# Patient Record
Sex: Female | Born: 1990 | Race: White | Hispanic: No | Smoking: Current every day smoker
Health system: Southern US, Community
[De-identification: ages and names within clinical notes are randomized; demographics above are authoritative.]

## PROBLEM LIST (undated history)

## (undated) DIAGNOSIS — F4310 Post-traumatic stress disorder, unspecified: Secondary | ICD-10-CM

## (undated) DIAGNOSIS — Z331 Pregnant state, incidental: Secondary | ICD-10-CM

## (undated) DIAGNOSIS — R87619 Unspecified abnormal cytological findings in specimens from cervix uteri: Secondary | ICD-10-CM

## (undated) DIAGNOSIS — Z720 Tobacco use: Secondary | ICD-10-CM

## (undated) DIAGNOSIS — I10 Essential (primary) hypertension: Secondary | ICD-10-CM

## (undated) DIAGNOSIS — F411 Generalized anxiety disorder: Secondary | ICD-10-CM

## (undated) DIAGNOSIS — IMO0002 Reserved for concepts with insufficient information to code with codable children: Secondary | ICD-10-CM

## (undated) DIAGNOSIS — N289 Disorder of kidney and ureter, unspecified: Secondary | ICD-10-CM

## (undated) DIAGNOSIS — J45909 Unspecified asthma, uncomplicated: Secondary | ICD-10-CM

## (undated) HISTORY — DX: Reserved for concepts with insufficient information to code with codable children: IMO0002

## (undated) HISTORY — DX: Essential (primary) hypertension: I10

## (undated) HISTORY — DX: Tobacco use: Z72.0

## (undated) HISTORY — DX: Unspecified abnormal cytological findings in specimens from cervix uteri: R87.619

## (undated) HISTORY — DX: Unspecified asthma, uncomplicated: J45.909

---
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
## 2011-03-14 NOTE — Op Note (Signed)
NAMEWILMETTA, SPEISER               ACCOUNT NO.:  [id number]   MEDICAL RECORD NO.:  [id number]          PATIENT TYPE:  AMB   LOCATION:  SDS                          FACILITY:  [facility]   PHYSICIAN:  Jon Gills, M.D.  DATE OF BIRTH:  11-Jul-1991   DATE OF PROCEDURE:  12/26/2005  DATE OF DISCHARGE:  12/26/2005                                 OPERATIVE REPORT   PREOPERATIVE DIAGNOSIS:  Epigastric abdominal pain.   POSTOPERATIVE DIAGNOSIS:  Epigastric abdominal pain.   PROCEDURE:  Upper endoscopy.   SURGEON:  Jon Gills, M.D.   ASSISTANTS:  None   DESCRIPTION OF FINDINGS:  Following informed written consent, the patient  was taken to the operating room and placed under general anesthesia with  continuous cardiopulmonary monitoring.  She remained in the supine position  and Olympus endoscope was inserted by mouth and advanced without difficulty.  There was no visual evidence for esophagitis, gastritis, duodenitis or  peptic ulcer disease.  Minimal nodularity was present in her stomach.  A  solitary gastric biopsy was negative for Helicobacter.  Esophageal, gastric  and duodenal biopsies were obtained and were also unremarkable.  The  endoscope was gradually withdrawn and she was awakened and taken to the  recovery room in satisfactory condition.  She will be released later today  to the care of her family.   DESCRIPTION OF TECHNICAL PROCEDURE USED:  Olympus GIF-160 endoscope with  cold biopsy forceps.   DESCRIPTION OF SPECIMENS REMOVED:  Esophagus x3 in formalin, gastric x1 for  CLOtesting, gastric x3 in formalin, duodenum x3 in formalin.           ______________________________  Jon Gills, M.D.     JHC/MEDQ  D:  01/29/2006  T:  01/30/2006  Job:  [id number]   cc:   Donna Bernard, M.D.  Fax: [phone]

## 2011-03-15 ENCOUNTER — Emergency Department (HOSPITAL_COMMUNITY)
Admission: EM | Admit: 2011-03-15 | Discharge: 2011-03-15 | Disposition: A | Discharge status: Home / Self Care | Payer: PRIVATE HEALTH INSURANCE | Attending: Emergency Medicine | Admitting: Emergency Medicine

## 2011-03-15 ENCOUNTER — Emergency Department (HOSPITAL_COMMUNITY): Payer: PRIVATE HEALTH INSURANCE

## 2011-03-15 DIAGNOSIS — R109 Unspecified abdominal pain: Secondary | ICD-10-CM | POA: Insufficient documentation

## 2011-03-15 DIAGNOSIS — N201 Calculus of ureter: Secondary | ICD-10-CM | POA: Insufficient documentation

## 2011-03-15 LAB — COMPREHENSIVE METABOLIC PANEL
ALT: 13 U/L (ref 0–35)
AST: 18 U/L (ref 0–37)
Alkaline Phosphatase: 67 U/L (ref 39–117)
CO2: 23 mEq/L (ref 19–32)
Chloride: 101 mEq/L (ref 96–112)
GFR calc Af Amer: >60 mL/min (ref >60)
GFR calc non Af Amer: >60 mL/min (ref >60)
Glucose, Bld: 125 mg/dL — ABNORMAL HIGH (ref 70–99)
Potassium: 3.5 mEq/L (ref 3.5–5.1)
Sodium: 138 mEq/L (ref 135–145)
Total Bilirubin: 1.1 mg/dL (ref 0.3–1.2)

## 2011-03-15 LAB — URINALYSIS, ROUTINE W REFLEX MICROSCOPIC
Bilirubin Urine: NEGATIVE
Ketones, ur: NEGATIVE mg/dL
Nitrite: NEGATIVE
Specific Gravity, Urine: >1.03 — ABNORMAL HIGH (ref 1.005–1.030)
Urobilinogen, UA: 0.2 mg/dL (ref 0.0–1.0)

## 2011-03-15 LAB — CBC
HCT: 39.8 % (ref 36.0–46.0)
Hemoglobin: 14.2 g/dL (ref 12.0–15.0)
MCHC: 35.7 g/dL (ref 30.0–36.0)
RBC: 5.01 MIL/uL (ref 3.87–5.11)

## 2011-03-15 LAB — DIFFERENTIAL
Basophils Absolute: 0 10*3/uL (ref 0.0–0.1)
Basophils Relative: 1 % (ref 0–1)
Lymphocytes Relative: 34 % (ref 12–46)
Monocytes Absolute: 0.4 10*3/uL (ref 0.1–1.0)
Neutro Abs: 4.6 10*3/uL (ref 1.7–7.7)
Neutrophils Relative %: 59 % (ref 43–77)

## 2011-03-15 LAB — LIPASE, BLOOD: Lipase: 14 U/L (ref 11–59)

## 2011-03-15 LAB — URINE MICROSCOPIC-ADD ON

## 2011-08-01 LAB — URINALYSIS, ROUTINE W REFLEX MICROSCOPIC
Glucose, UA: NEGATIVE
Protein, ur: NEGATIVE
Specific Gravity, Urine: 1.01
Urobilinogen, UA: 0.2

## 2011-08-14 LAB — URINALYSIS, ROUTINE W REFLEX MICROSCOPIC
Ketones, ur: NEGATIVE
Nitrite: NEGATIVE
Protein, ur: NEGATIVE
pH: 7.5

## 2011-08-14 LAB — URINE MICROSCOPIC-ADD ON

## 2011-11-24 ENCOUNTER — Other Ambulatory Visit (HOSPITAL_COMMUNITY)
Admission: RE | Admit: 2011-11-24 | Discharge: 2011-11-24 | Disposition: A | Discharge status: Home / Self Care | Payer: PRIVATE HEALTH INSURANCE | Source: Ambulatory Visit | Attending: Obstetrics and Gynecology | Admitting: Obstetrics and Gynecology

## 2011-11-24 DIAGNOSIS — Z01419 Encounter for gynecological examination (general) (routine) without abnormal findings: Secondary | ICD-10-CM | POA: Insufficient documentation

## 2011-11-24 DIAGNOSIS — Z113 Encounter for screening for infections with a predominantly sexual mode of transmission: Secondary | ICD-10-CM | POA: Insufficient documentation

## 2011-11-24 DIAGNOSIS — R87810 Cervical high risk human papillomavirus (HPV) DNA test positive: Secondary | ICD-10-CM | POA: Insufficient documentation

## 2011-11-24 LAB — OB RESULTS CONSOLE RUBELLA ANTIBODY, IGM: Rubella: IMMUNE

## 2011-11-24 LAB — OB RESULTS CONSOLE ANTIBODY SCREEN: Antibody Screen: NEGATIVE

## 2011-11-24 LAB — OB RESULTS CONSOLE ABO/RH: RH Type: NEGATIVE

## 2011-11-24 LAB — OB RESULTS CONSOLE HEPATITIS B SURFACE ANTIGEN: Hepatitis B Surface Ag: NEGATIVE

## 2011-11-25 ENCOUNTER — Other Ambulatory Visit: Payer: Self-pay | Admitting: Adult Health

## 2011-12-27 ENCOUNTER — Emergency Department (HOSPITAL_COMMUNITY): Payer: PRIVATE HEALTH INSURANCE

## 2011-12-27 ENCOUNTER — Encounter (HOSPITAL_COMMUNITY): Payer: Self-pay | Admitting: *Deleted

## 2011-12-27 ENCOUNTER — Emergency Department (HOSPITAL_COMMUNITY)
Admission: EM | Admit: 2011-12-27 | Discharge: 2011-12-27 | Disposition: A | Discharge status: Home / Self Care | Payer: PRIVATE HEALTH INSURANCE | Attending: Emergency Medicine | Admitting: Emergency Medicine

## 2011-12-27 DIAGNOSIS — R109 Unspecified abdominal pain: Secondary | ICD-10-CM | POA: Insufficient documentation

## 2011-12-27 DIAGNOSIS — N200 Calculus of kidney: Secondary | ICD-10-CM | POA: Insufficient documentation

## 2011-12-27 DIAGNOSIS — O9989 Other specified diseases and conditions complicating pregnancy, childbirth and the puerperium: Secondary | ICD-10-CM | POA: Insufficient documentation

## 2011-12-27 DIAGNOSIS — R112 Nausea with vomiting, unspecified: Secondary | ICD-10-CM | POA: Insufficient documentation

## 2011-12-27 HISTORY — DX: Pregnant state, incidental: Z33.1

## 2011-12-27 HISTORY — DX: Disorder of kidney and ureter, unspecified: N28.9

## 2011-12-27 LAB — URINE MICROSCOPIC-ADD ON

## 2011-12-27 LAB — URINALYSIS, ROUTINE W REFLEX MICROSCOPIC
Bilirubin Urine: NEGATIVE
Glucose, UA: NEGATIVE mg/dL
Specific Gravity, Urine: >1.03 — ABNORMAL HIGH (ref 1.005–1.030)

## 2011-12-27 LAB — DIFFERENTIAL
Basophils Absolute: 0 10*3/uL (ref 0.0–0.1)
Lymphocytes Relative: 8 % — ABNORMAL LOW (ref 12–46)
Monocytes Absolute: 0.3 10*3/uL (ref 0.1–1.0)
Neutro Abs: 12 10*3/uL — ABNORMAL HIGH (ref 1.7–7.7)
Neutrophils Relative %: 90 % — ABNORMAL HIGH (ref 43–77)

## 2011-12-27 LAB — CBC
HCT: 39.7 % (ref 36.0–46.0)
RDW: 12.7 % (ref 11.5–15.5)
WBC: 13.3 10*3/uL — ABNORMAL HIGH (ref 4.0–10.5)

## 2011-12-27 LAB — BASIC METABOLIC PANEL
CO2: 21 mEq/L (ref 19–32)
Chloride: 98 mEq/L (ref 96–112)
Creatinine, Ser: 0.68 mg/dL (ref 0.50–1.10)
GFR calc Af Amer: >90 mL/min (ref >90)
Potassium: 3.4 mEq/L — ABNORMAL LOW (ref 3.5–5.1)
Sodium: 134 mEq/L — ABNORMAL LOW (ref 135–145)

## 2011-12-27 LAB — PREGNANCY, URINE: Preg Test, Ur: POSITIVE — AB

## 2011-12-27 MED ORDER — ONDANSETRON HCL 4 MG/2ML IJ SOLN
4.0000 mg | Freq: Once | INTRAMUSCULAR | Status: AC
  Administered 2011-12-27: 4 mg via INTRAVENOUS
  Filled 2011-12-27: qty 2

## 2011-12-27 MED ORDER — HYDROMORPHONE HCL PF 1 MG/ML IJ SOLN
1.0000 mg | Freq: Once | INTRAMUSCULAR | Status: AC
  Administered 2011-12-27: 1 mg via INTRAVENOUS
  Filled 2011-12-27: qty 1

## 2011-12-27 MED ORDER — SODIUM CHLORIDE 0.9 % IV SOLN
Freq: Once | INTRAVENOUS | Status: AC
  Administered 2011-12-27: 11:00:00 via INTRAVENOUS

## 2011-12-27 MED ORDER — ONDANSETRON HCL 4 MG PO TABS: 4.0000 mg | ORAL_TABLET | Freq: Four times a day (QID) | ORAL | Status: AC

## 2011-12-27 MED ORDER — HYDROCODONE-ACETAMINOPHEN 5-325 MG PO TABS: 1.0000 | ORAL_TABLET | ORAL | Status: AC | PRN

## 2011-12-27 MED ORDER — KETOROLAC TROMETHAMINE 30 MG/ML IJ SOLN: 30.0000 mg | Freq: Once | INTRAMUSCULAR | Status: DC

## 2011-12-27 NOTE — ED Notes (Signed)
Family at bedside. Patient states she is having a lot of pain at this time. RN notified.

## 2011-12-27 NOTE — ED Notes (Signed)
Right flank pain began last night with nausea and vomiting. Hx of kidney stones. Also c/o trouble urinating. 3 mo. pregnant

## 2011-12-27 NOTE — ED Provider Notes (Signed)
History  Scribed for [employer]. Colon Branch, MD, the patient was seen in room APA11/APA11. This chart was scribed by Candelaria Stagers. The patient's care started at 12:38 PM    CSN: [id number]  Arrival date & time 12/27/11  4098   First MD Initiated Contact with Patient 12/27/11 1228      Chief Complaint  Patient presents with  . Nephrolithiasis     Patient is a 21 y.o. female presenting with flank pain. The history is provided by the patient.  Flank Pain This is a new problem. The current episode started 12 to 24 hours ago. The problem occurs constantly. The problem has been gradually worsening. Associated symptoms include abdominal pain. The symptoms are aggravated by nothing. The symptoms are relieved by nothing. She has tried nothing for the symptoms. The treatment provided no relief.   Janice Kirk is a 21 y.o. female who presents to the Emergency Department complaining of right lower abdominal pain that started last night and has gotten worse.  This pain radiates to her right flank.  She is also experiencing nausea, vomiting, and trouble urinating.  She has a h/o kidney stones with her last stone about one year ago which she passed on her own.  She denies blood in the urine.  Pt is [redacted] weeks pregnant.  Her PCP is Dr. Gerda Diss. Prenatal care with Dr. Emelda Fear    Past Medical History  Diagnosis Date  . Renal disorder     kidney stones  . Pregnant state, incidental     History reviewed. No pertinent past surgical history.  No family history on file.  History  Substance Use Topics  . Smoking status: Never Smoker   . Smokeless tobacco: Not on file  . Alcohol Use: No    OB History    Grav Para Term Preterm Abortions TAB SAB Ect Mult Living   1               Review of Systems  Gastrointestinal: Positive for nausea, vomiting and abdominal pain.  Genitourinary: Positive for flank pain and difficulty urinating. Negative for hematuria.  All other systems reviewed and are  negative.    Allergies  Review of patient's allergies indicates no known allergies.  Home Medications  No current outpatient prescriptions on file.  BP 121/58  Pulse 68  Temp(Src) 98.2 F (36.8 C) (Oral)  Resp 16  SpO2 100%  LMP 09/15/2011  Physical Exam  Nursing note and vitals reviewed. Constitutional: She is oriented to person, place, and time. She appears well-developed and well-nourished. No distress.  HENT:  Head: Normocephalic and atraumatic.  Eyes: EOM are normal. Right eye exhibits no discharge. Left eye exhibits no discharge.  Neck: Normal range of motion. Neck supple.  Cardiovascular: Normal rate and regular rhythm.   Pulmonary/Chest: Effort normal. No respiratory distress.  Abdominal:       Right flank pain   Musculoskeletal: She exhibits no edema and no tenderness.  Neurological: She is alert and oriented to person, place, and time.  Skin: Skin is warm and dry. She is not diaphoretic.  Psychiatric: She has a normal mood and affect. Her behavior is normal.    ED Course  Procedures   DIAGNOSTIC STUDIES: Oxygen Saturation is 100% on room air, normal by my interpretation.    COORDINATION OF CARE:  12:41PM Ordered: Pregnancy, urine ; ketorolac (TORADOL) 30 MG/ML injection 30 mg ; HYDROmorphone (DILAUDID) injection 1 mg ; ondansetron (ZOFRAN) injection 4 mg    Labs Reviewed  URINALYSIS, ROUTINE W REFLEX MICROSCOPIC - Abnormal; Notable for the following:    APPearance HAZY (*)    Specific Gravity, Urine >1.030 (*)    Hgb urine dipstick MODERATE (*)    Ketones, ur >80 (*)    All other components within normal limits  CBC - Abnormal; Notable for the following:    WBC 13.3 (*)    MCHC 36.5 (*)    All other components within normal limits  DIFFERENTIAL - Abnormal; Notable for the following:    Neutrophils Relative 90 (*)    Neutro Abs 12.0 (*)    Lymphocytes Relative 8 (*)    Monocytes Relative 2 (*)    All other components within normal limits  BASIC  METABOLIC PANEL - Abnormal; Notable for the following:    Sodium 134 (*)    Potassium 3.4 (*)    Glucose, Bld 110 (*)    All other components within normal limits  URINE MICROSCOPIC-ADD ON - Abnormal; Notable for the following:    Squamous Epithelial / LPF FEW (*)    All other components within normal limits  PREGNANCY, URINE - Abnormal; Notable for the following:    Preg Test, Ur POSITIVE (*)    All other components within normal limits      MDM  Pregnant patient with flank pain and abdominal pain similar to previous kidney stones. Given IVF, analgesics, antiemetic with some relief. Pt feels improved after observation and/or treatment in ED.Pt stable in ED with no significant deterioration in condition.The patient appears reasonably screened and/or stabilized for discharge and I doubt any other medical condition or other [facility] requiring further screening, evaluation, or treatment in the ED at this time prior to discharge.  I personally performed the services described in this documentation, which was scribed in my presence. The recorded information has been reviewed and considered.   MDM Reviewed: nursing note and vitals Interpretation: labs         Nicoletta Dress. Colon Branch, MD 12/27/11 1442

## 2011-12-27 NOTE — Discharge Instructions (Signed)
DRINK LOTS OF FLUIDS. USE THE PAIN AND NAUSEA MEDICINE AS DIRECTED. FOLLOW UP WITH THE UROLOGIST AND YOUR OB/GYN.   Kidney Stones Kidney stones (ureteral lithiasis) are solid masses that form inside your kidneys. The intense pain is caused by the stone moving through the kidney, ureter, bladder, and urethra (urinary tract). When the stone moves, the ureter starts to spasm around the stone. The stone is usually passed in the urine.  HOME CARE  Drink enough fluids to keep your pee (urine) clear or pale yellow. This helps to get the stone out.   Strain all pee through the provided strainer. Do not pee without peeing through the strainer, not even once. If you pee the stone out, catch it. The stone may be as small as a grain of salt. Take this to your doctor.   Only take medicine as told by your doctor.   Follow up with your doctor as told.   Get follow-up X-rays as told by your doctor.  GET HELP RIGHT AWAY IF:   Your pain does not get better with medicine.   You have a fever.   Your pain increases and gets worse over 18 hours.   You have new belly (abdominal) pain.   You feel faint or pass out.  MAKE SURE YOU:   Understand these instructions.   Will watch your condition.   Will get help right away if you are not doing well or get worse.  Document Released: 03/31/2008 Document Revised: 06/25/2011 Document Reviewed: 08/10/2009 [facility] Patient Information 2012 [place], [place].

## 2012-06-13 ENCOUNTER — Encounter (HOSPITAL_COMMUNITY): Payer: Self-pay | Admitting: Anesthesiology

## 2012-06-13 ENCOUNTER — Inpatient Hospital Stay (HOSPITAL_COMMUNITY)
Admission: AD | Admit: 2012-06-13 | Discharge: 2012-06-16 | DRG: 774 | Disposition: A | Discharge status: Home / Self Care | Payer: Medicaid Other | Source: Ambulatory Visit | Attending: Obstetrics & Gynecology | Admitting: Obstetrics & Gynecology

## 2012-06-13 ENCOUNTER — Encounter (HOSPITAL_COMMUNITY): Payer: Self-pay | Admitting: *Deleted

## 2012-06-13 ENCOUNTER — Inpatient Hospital Stay (HOSPITAL_COMMUNITY): Payer: Medicaid Other | Admitting: Anesthesiology

## 2012-06-13 DIAGNOSIS — O99892 Other specified diseases and conditions complicating childbirth: Secondary | ICD-10-CM | POA: Diagnosis present

## 2012-06-13 DIAGNOSIS — O0990 Supervision of high risk pregnancy, unspecified, unspecified trimester: Secondary | ICD-10-CM

## 2012-06-13 DIAGNOSIS — O1400 Mild to moderate pre-eclampsia, unspecified trimester: Secondary | ICD-10-CM | POA: Diagnosis present

## 2012-06-13 DIAGNOSIS — IMO0002 Reserved for concepts with insufficient information to code with codable children: Secondary | ICD-10-CM | POA: Diagnosis present

## 2012-06-13 DIAGNOSIS — Z22330 Carrier of Group B streptococcus: Secondary | ICD-10-CM

## 2012-06-13 LAB — COMPREHENSIVE METABOLIC PANEL
Alkaline Phosphatase: 195 U/L — ABNORMAL HIGH (ref 39–117)
BUN: 10 mg/dL (ref 6–23)
Calcium: 10 mg/dL (ref 8.4–10.5)
Creatinine, Ser: 0.64 mg/dL (ref 0.50–1.10)
GFR calc Af Amer: >90 mL/min (ref >90)
Glucose, Bld: 85 mg/dL (ref 70–99)
Potassium: 3.9 mEq/L (ref 3.5–5.1)
Total Protein: 6.7 g/dL (ref 6.0–8.3)

## 2012-06-13 LAB — CBC
HCT: 35.2 % — ABNORMAL LOW (ref 36.0–46.0)
HCT: 32.9 % — ABNORMAL LOW (ref 36.0–46.0)
Hemoglobin: 10.8 g/dL — ABNORMAL LOW (ref 12.0–15.0)
MCH: 25 pg — ABNORMAL LOW (ref 26.0–34.0)
MCH: 25 pg — ABNORMAL LOW (ref 26.0–34.0)
MCHC: 33.2 g/dL (ref 30.0–36.0)
MCHC: 32.8 g/dL (ref 30.0–36.0)
MCV: 75.2 fL — ABNORMAL LOW (ref 78.0–100.0)
MCV: 76.2 fL — ABNORMAL LOW (ref 78.0–100.0)
RDW: 13.2 % (ref 11.5–15.5)

## 2012-06-13 LAB — URINALYSIS, ROUTINE W REFLEX MICROSCOPIC
Bilirubin Urine: NEGATIVE
Glucose, UA: NEGATIVE mg/dL
Ketones, ur: 15 mg/dL — AB
pH: 7 (ref 5.0–8.0)

## 2012-06-13 LAB — OB RESULTS CONSOLE GBS: GBS: POSITIVE

## 2012-06-13 LAB — URINE MICROSCOPIC-ADD ON

## 2012-06-13 MED ORDER — ACETAMINOPHEN 325 MG PO TABS: 650.0000 mg | ORAL_TABLET | ORAL | Status: DC | PRN

## 2012-06-13 MED ORDER — CITRIC ACID-SODIUM CITRATE 334-500 MG/5ML PO SOLN: 30.0000 mL | ORAL | Status: DC | PRN

## 2012-06-13 MED ORDER — LACTATED RINGERS IV SOLN: 500.0000 mL | Freq: Once | INTRAVENOUS | Status: DC

## 2012-06-13 MED ORDER — PENICILLIN G POTASSIUM 5000000 UNITS IJ SOLR
2.5000 10*6.[IU] | INTRAVENOUS | Status: DC
  Filled 2012-06-13 (×3): qty 2.5

## 2012-06-13 MED ORDER — OXYCODONE-ACETAMINOPHEN 5-325 MG PO TABS: 1.0000 | ORAL_TABLET | ORAL | Status: DC | PRN

## 2012-06-13 MED ORDER — LIDOCAINE HCL (PF) 1 % IJ SOLN
30.0000 mL | INTRAMUSCULAR | Status: DC | PRN
  Filled 2012-06-13: qty 30

## 2012-06-13 MED ORDER — OXYTOCIN 40 UNITS IN LACTATED RINGERS INFUSION - SIMPLE MED
62.5000 mL/h | Freq: Once | INTRAVENOUS | Status: AC
  Administered 2012-06-13: 62.5 mL/h via INTRAVENOUS
  Filled 2012-06-13: qty 1000

## 2012-06-13 MED ORDER — LACTATED RINGERS IV SOLN
500.0000 mL | INTRAVENOUS | Status: DC | PRN
  Administered 2012-06-13: 1000 mL via INTRAVENOUS

## 2012-06-13 MED ORDER — ONDANSETRON HCL 4 MG/2ML IJ SOLN: 4.0000 mg | Freq: Four times a day (QID) | INTRAMUSCULAR | Status: DC | PRN

## 2012-06-13 MED ORDER — LACTATED RINGERS IV SOLN: INTRAVENOUS | Status: DC

## 2012-06-13 MED ORDER — BENZOCAINE-MENTHOL 20-0.5 % EX AERO
1.0000 "application " | INHALATION_SPRAY | CUTANEOUS | Status: DC | PRN
  Administered 2012-06-13 – 2012-06-15 (×2): 1 via TOPICAL
  Filled 2012-06-13 (×2): qty 56

## 2012-06-13 MED ORDER — PENICILLIN G POTASSIUM 5000000 UNITS IJ SOLR
5.0000 10*6.[IU] | Freq: Once | INTRAVENOUS | Status: DC
  Filled 2012-06-13: qty 5

## 2012-06-13 MED ORDER — PRENATAL MULTIVITAMIN CH
1.0000 | ORAL_TABLET | Freq: Every day | ORAL | Status: DC
  Administered 2012-06-13 – 2012-06-16 (×4): 1 via ORAL
  Filled 2012-06-13 (×4): qty 1

## 2012-06-13 MED ORDER — SIMETHICONE 80 MG PO CHEW: 80.0000 mg | CHEWABLE_TABLET | ORAL | Status: DC | PRN

## 2012-06-13 MED ORDER — LACTATED RINGERS IV SOLN
INTRAVENOUS | Status: DC
  Administered 2012-06-14: via INTRAVENOUS

## 2012-06-13 MED ORDER — TETANUS-DIPHTH-ACELL PERTUSSIS 5-2.5-18.5 LF-MCG/0.5 IM SUSP
0.5000 mL | Freq: Once | INTRAMUSCULAR | Status: AC
  Administered 2012-06-14: 0.5 mL via INTRAMUSCULAR
  Filled 2012-06-13: qty 0.5

## 2012-06-13 MED ORDER — DIPHENHYDRAMINE HCL 25 MG PO CAPS: 25.0000 mg | ORAL_CAPSULE | Freq: Four times a day (QID) | ORAL | Status: DC | PRN

## 2012-06-13 MED ORDER — PHENYLEPHRINE 40 MCG/ML (10ML) SYRINGE FOR IV PUSH (FOR BLOOD PRESSURE SUPPORT): 80.0000 ug | PREFILLED_SYRINGE | INTRAVENOUS | Status: DC | PRN

## 2012-06-13 MED ORDER — ONDANSETRON HCL 4 MG PO TABS: 4.0000 mg | ORAL_TABLET | ORAL | Status: DC | PRN

## 2012-06-13 MED ORDER — FENTANYL 2.5 MCG/ML BUPIVACAINE 1/10 % EPIDURAL INFUSION (WH - ANES)
14.0000 mL/h | INTRAMUSCULAR | Status: DC
  Administered 2012-06-13: 14 mL/h via EPIDURAL
  Filled 2012-06-13 (×2): qty 60

## 2012-06-13 MED ORDER — EPHEDRINE 5 MG/ML INJ: 10.0000 mg | INTRAVENOUS | Status: DC | PRN

## 2012-06-13 MED ORDER — ONDANSETRON HCL 4 MG/2ML IJ SOLN: 4.0000 mg | INTRAMUSCULAR | Status: DC | PRN

## 2012-06-13 MED ORDER — PHENYLEPHRINE 40 MCG/ML (10ML) SYRINGE FOR IV PUSH (FOR BLOOD PRESSURE SUPPORT)
80.0000 ug | PREFILLED_SYRINGE | INTRAVENOUS | Status: DC | PRN
  Filled 2012-06-13: qty 5

## 2012-06-13 MED ORDER — IBUPROFEN 600 MG PO TABS: 600.0000 mg | ORAL_TABLET | Freq: Four times a day (QID) | ORAL | Status: DC | PRN

## 2012-06-13 MED ORDER — ZOLPIDEM TARTRATE 5 MG PO TABS: 5.0000 mg | ORAL_TABLET | Freq: Every evening | ORAL | Status: DC | PRN

## 2012-06-13 MED ORDER — DIPHENHYDRAMINE HCL 50 MG/ML IJ SOLN: 12.5000 mg | INTRAMUSCULAR | Status: DC | PRN

## 2012-06-13 MED ORDER — IBUPROFEN 600 MG PO TABS
600.0000 mg | ORAL_TABLET | Freq: Four times a day (QID) | ORAL | Status: DC
  Administered 2012-06-13 – 2012-06-16 (×12): 600 mg via ORAL
  Filled 2012-06-13 (×12): qty 1

## 2012-06-13 MED ORDER — EPHEDRINE 5 MG/ML INJ
10.0000 mg | INTRAVENOUS | Status: DC | PRN
  Filled 2012-06-13: qty 4

## 2012-06-13 MED ORDER — LABETALOL HCL 5 MG/ML IV SOLN
5.0000 mg | INTRAVENOUS | Status: DC | PRN
  Administered 2012-06-13: 5 mg via INTRAVENOUS
  Filled 2012-06-13: qty 4

## 2012-06-13 MED ORDER — LANOLIN HYDROUS EX OINT: TOPICAL_OINTMENT | CUTANEOUS | Status: DC | PRN

## 2012-06-13 MED ORDER — LIDOCAINE HCL (PF) 1 % IJ SOLN
INTRAMUSCULAR | Status: DC | PRN
  Administered 2012-06-13 (×2): 8 mL

## 2012-06-13 MED ORDER — FLEET ENEMA 7-19 GM/118ML RE ENEM: 1.0000 | ENEMA | RECTAL | Status: DC | PRN

## 2012-06-13 MED ORDER — MAGNESIUM SULFATE BOLUS VIA INFUSION
4.0000 g | Freq: Once | INTRAVENOUS | Status: AC
  Administered 2012-06-13: 4 g via INTRAVENOUS
  Filled 2012-06-13: qty 500

## 2012-06-13 MED ORDER — OXYTOCIN BOLUS FROM INFUSION
250.0000 mL | Freq: Once | INTRAVENOUS | Status: AC
  Administered 2012-06-13: 250 mL via INTRAVENOUS
  Filled 2012-06-13: qty 500

## 2012-06-13 MED ORDER — FENTANYL 2.5 MCG/ML BUPIVACAINE 1/10 % EPIDURAL INFUSION (WH - ANES)
INTRAMUSCULAR | Status: DC | PRN
  Administered 2012-06-13: 14 mL/h via EPIDURAL

## 2012-06-13 MED ORDER — NALBUPHINE SYRINGE 5 MG/0.5 ML
10.0000 mg | INJECTION | INTRAMUSCULAR | Status: DC | PRN
  Administered 2012-06-13: 10 mg via INTRAVENOUS
  Filled 2012-06-13: qty 1

## 2012-06-13 MED ORDER — SENNOSIDES-DOCUSATE SODIUM 8.6-50 MG PO TABS
2.0000 | ORAL_TABLET | Freq: Every day | ORAL | Status: DC
  Administered 2012-06-13 – 2012-06-15 (×3): 2 via ORAL

## 2012-06-13 MED ORDER — WITCH HAZEL-GLYCERIN EX PADS
1.0000 "application " | MEDICATED_PAD | CUTANEOUS | Status: DC | PRN
  Administered 2012-06-14: 1 via TOPICAL

## 2012-06-13 MED ORDER — SODIUM CHLORIDE 0.9 % IV SOLN
2.0000 g | Freq: Once | INTRAVENOUS | Status: AC
  Administered 2012-06-13: 2 g via INTRAVENOUS
  Filled 2012-06-13: qty 2000

## 2012-06-13 MED ORDER — MAGNESIUM SULFATE 40 G IN LACTATED RINGERS - SIMPLE
2.0000 g/h | INTRAVENOUS | Status: DC
  Administered 2012-06-14: 2 g/h via INTRAVENOUS
  Filled 2012-06-13 (×2): qty 500

## 2012-06-13 MED ORDER — LABETALOL HCL 5 MG/ML IV SOLN
10.0000 mg | INTRAVENOUS | Status: DC | PRN
  Administered 2012-06-13: 10 mg via INTRAVENOUS
  Filled 2012-06-13 (×2): qty 4

## 2012-06-13 MED ORDER — DIBUCAINE 1 % RE OINT: 1.0000 "application " | TOPICAL_OINTMENT | RECTAL | Status: DC | PRN

## 2012-06-13 MED ORDER — OXYTOCIN 40 UNITS IN LACTATED RINGERS INFUSION - SIMPLE MED: 62.5000 mL/h | INTRAVENOUS | Status: DC | PRN

## 2012-06-13 NOTE — Anesthesia Preprocedure Evaluation (Signed)
Anesthesia Evaluation  Patient identified by MRN, date of birth, ID band Patient awake    Reviewed: Allergy & Precautions, H&P , NPO status , Patient's Chart, lab work & pertinent test results  Airway Mallampati: I TM Distance: >3 FB Neck ROM: full    Dental No notable dental hx.    Pulmonary neg pulmonary ROS,  breath sounds clear to auscultation  Pulmonary exam normal       Cardiovascular negative cardio ROS      Neuro/Psych negative neurological ROS  negative psych ROS   GI/Hepatic negative GI ROS, Neg liver ROS,   Endo/Other  negative endocrine ROS  Renal/GU negative Renal ROS     Musculoskeletal negative musculoskeletal ROS (+)   Abdominal Normal abdominal exam  (+)   Peds negative pediatric ROS (+)  Hematology negative hematology ROS (+)   Anesthesia Other Findings   Reproductive/Obstetrics (+) Pregnancy                           Anesthesia Physical Anesthesia Plan  ASA: II  Anesthesia Plan: Epidural   Post-op Pain Management:    Induction:   Airway Management Planned:   Additional Equipment:   Intra-op Plan:   Post-operative Plan:   Informed Consent: I have reviewed the patients History and Physical, chart, labs and discussed the procedure including the risks, benefits and alternatives for the proposed anesthesia with the patient or authorized representative who has indicated his/her understanding and acceptance.     Plan Discussed with:   Anesthesia Plan Comments:         Anesthesia Quick Evaluation

## 2012-06-13 NOTE — Progress Notes (Signed)
Up to BR.

## 2012-06-13 NOTE — Progress Notes (Signed)
Dr Durene Cal notified of pt's admission and status. Aware of elevated B/Ps, SROM cl fld, ctx pattern, reactive fhr. Will see pt

## 2012-06-13 NOTE — Progress Notes (Addendum)
Pt having real bad shakes, to evaluate next BP

## 2012-06-13 NOTE — Anesthesia Procedure Notes (Signed)
Epidural Patient location during procedure: OB Start time: 06/13/2012 5:16 AM End time: 06/13/2012 5:20 AM Reason for block: procedure for pain  Staffing Anesthesiologist: Sandrea Hughs Performed by: anesthesiologist   Preanesthetic Checklist Completed: patient identified, site marked, surgical consent, pre-op evaluation, timeout performed, IV checked, risks and benefits discussed and monitors and equipment checked  Epidural Patient position: sitting Prep: site prepped and draped and DuraPrep Patient monitoring: continuous pulse ox and blood pressure Approach: midline Injection technique: LOR air  Needle:  Needle type: Tuohy  Needle gauge: 17 G Needle length: 9 cm Needle insertion depth: 6 cm Catheter type: closed end flexible Catheter size: 19 Gauge Catheter at skin depth: 11 cm Test dose: negative and Other  Assessment Sensory level: T8 Events: blood not aspirated, injection not painful, no injection resistance, negative IV test and no paresthesia

## 2012-06-13 NOTE — Progress Notes (Signed)
Patient ID: Janice Kirk, female   DOB: 02-Feb-1991, 21 y.o.   MRN: [id number] Delivery Note At 10:57 AM a viable and healthy female was delivered via Vaginal, Spontaneous Delivery (Presentation: Right Occiput Anterior).  APGAR: 9, 9; weight .   Placenta status: Intact, Spontaneous.  Cord: 3 vessels with the following complications: None.  Cord pH: not collected due to fetal wellbeing   The left arm was released completely before delivery of the fetal body  Anesthesia: Epidural  Episiotomy: None Lacerations: 1st degree;Periurethral not requiring repair Suture Repair: none Est. Blood Loss (mL):   Mom to AICU.  Baby to nursery-stable.  Mase Dhondt V 06/13/2012, 11:11 AM

## 2012-06-13 NOTE — Progress Notes (Signed)
Janice Kirk is a 21 y.o. G1P0 at [redacted]w[redacted]d  admitted for active labor with elevated blood pressures concerning for preeclampsia.   Subjective: Pain control and BP improved with epidural  Objective: BP 139/87  Pulse 89  Temp 98.2 F (36.8 C) (Oral)  Resp 20  Ht 5' 2.5" (1.588 m)  Wt 72.576 kg (160 lb)  BMI 28.80 kg/m2  SpO2 99%   Total I/O In: 3007.1 [P.O.:360; I.V.:2597.1; IV Piggyback:50] Out: 0   FHT:  FHR: 130 bpm, variability: minimal majority of time but also periods of moderate,  accelerations:  Abscent,  decelerations:  Absent UC:   irregular, every 2-5 minutes SVE:   Dilation: 7.5 Effacement (%): 90 Station: +1 Exam by:: c soliz rn  Labs: Lab Results  Component Value Date   WBC 11.9* 06/13/2012   HGB 11.7* 06/13/2012   HCT 35.2* 06/13/2012   MCV 75.2* 06/13/2012   PLT 153 06/13/2012    Assessment / Plan: Spontaneous labor, progressing normally  Labor: Progressing normally Preeclampsia:  on magnesium sulfate and no signs or symptoms of toxicity. Labs reviewed-ast/alt not elevated. Platelets wnl. UA and pr/cr ratio pending.  Fetal Wellbeing:  Category II Pain Control:  Epidural I/D:  PCN for GBS Anticipated MOD:  NSVD  HUNTER, STEPHEN 06/13/2012, 6:33 AM

## 2012-06-13 NOTE — MAU Note (Signed)
Patient stated, " My water broke at 0045 and there was a puddle on the floor and it keeps running out."

## 2012-06-13 NOTE — H&P (Signed)
Janice Kirk 21 y.o. female  ([facility] patient) G1P0 at [redacted]w[redacted]d presenting with SROM.   Patient reports water breaking at 12:45 AM with large rush of fluid. She has continued to leak since that time. Nursing noted large amount of fluid in sweat pants she was wearing when presented to MAU. Patient has been contracting since Friday (presented to [facility] at that time due to some bleeding and was checked and was 2cm with slight bloody show). Continued to contract throughout weekend with contractions increasing in frequency to every 10 minutes several hours ago.   Patient denies contractions/decreased fetal movement/discharge/blood from vagina/rush of fluid.  No headache, changes in vision, shortness of breath, or increased edema. Slight RUQ pain.    Maternal Medical History:  Reason for admission: Reason for admission: rupture of membranes.  Contractions: Onset was yesterday.   Frequency: irregular.   Perceived severity is moderate.    Fetal activity: Perceived fetal activity is normal.   Last perceived fetal movement was within the past hour.      OB History    Grav Para Term Preterm Abortions TAB SAB Ect Mult Living   1              Past Medical History  Diagnosis Date  . Renal disorder     kidney stones  . Pregnant state, incidental    History reviewed. No pertinent past surgical history. Family History: family history is not on file.  She is adopted. Social History:  reports that she has never smoked. She does not have any smokeless tobacco history on file. She reports that she does not drink alcohol or use illicit drugs.   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 Currently evaluating. , o negative  ROS-see HPI  Dilation: 3 Effacement (%): 60 Station: -2 Exam by:: Dr Durene Cal Blood pressure 175/102, pulse 105, temperature 98.2 F (36.8 C), temperature source Oral, height 5' 2.5" (1.588 m), weight 72.576 kg (160 lb), last menstrual period 09/15/2011. Maternal Exam:  Uterine Assessment: Contraction strength is moderate.  Contraction frequency is irregular.   Abdomen: Patient reports no abdominal tenderness. Fundal height is consistent with dates.   Fetal presentation: vertex  Introitus: Normal vulva. Normal vagina.    Fetal Exam Fetal Monitor Review: Baseline rate: 130.  Variability: moderate (6-25 bpm).   Pattern: accelerations present.    Fetal State Assessment: Category I - tracings are normal.     Physical Exam  Constitutional: She is oriented to person, place, and time. She appears well-developed and well-nourished.  HENT:  Head: Normocephalic and atraumatic.  Eyes: Conjunctivae and EOM are normal. Pupils are equal, round, and reactive to light.  Neck: Normal range of motion. Neck supple.  Cardiovascular: Normal rate and regular rhythm.  Exam reveals no gallop and no friction rub.   No murmur heard. Respiratory: Effort normal and breath sounds normal. She has no wheezes. She has no rales.  GI: Soft. Bowel sounds are normal.       Gravid  Musculoskeletal: Normal range of motion. She exhibits edema (1+ edema in hands and feet).  Neurological: She is alert and oriented to person, place, and time.  Skin:  Skin is warm and dry.    Prenatal labs: ABO, Rh: O/Negative/-- (01/28 0000) Antibody: Negative (01/28 0000) Rubella: Immune (01/28 0000) RPR: Nonreactive (01/28 0000)  HBsAg: Negative (01/28 0000)  HIV: Non-reactive (01/28 0000)  GBS: Negative (08/12 0000)   Assessment/Plan: Janice Kirk 21 y.o. female  G1P0 at [redacted]w[redacted]d  presenting with SROM.   Admit to L+D Patient contracting at least every 5 minutes, no need to augment at this time Gestational HTN-have ordered CBC, CMET, UA, Pr/Cr ratio to eval preeclampsia. Workup reportedly negative and patient not on any medications at baseline. Believe anxiety contributing to BP.  Will start Magnesium Sulfate for severe range HTN with every systolic reading >161 and diastolic reading >096.  labetalol BP >160/110 O negative-will require rhogam after birth GBS positive-PCN for prophylaxis Breastfeeding planned Uncertain about birth control.   HUNTER, STEPHEN 06/13/2012, 2:53 AM

## 2012-06-13 NOTE — Progress Notes (Signed)
Report given to [facility] in BS. Pt to Bs 163 via w/c

## 2012-06-13 NOTE — Progress Notes (Signed)
Janice Kirk is a 21 y.o. G1P0 at [redacted]w[redacted]d by ultrasound admitted for active labor, with concern for PRe-Eclampsia, with normal lft, , but Pr/Cr ratio ELEVATED at 1.56, on Mag Sulfate  Subjective:Epidural in place, good relief,    Objective: BP 131/94  Pulse 113  Temp 98.8 F (37.1 C) (Oral)  Resp 20  Ht 5' 2.5" (1.588 m)  Wt 72.576 kg (160 lb)  BMI 28.80 kg/m2  SpO2 99% I/O last 3 completed shifts: In: 3162.1 [P.O.:390; I.V.:2722.1; IV Piggyback:50] Out: 0  Total I/O In: 155 [P.O.:30; I.V.:125] Out: -   FHT:  FHR: 145 bpm, variability: moderate,  accelerations:  Present,  decelerations:  Absent UC:   regular, every 3 minutes SVE:   Dilation: Lip/rim Effacement (%): 100 Station: +2 Exam by:: Dr. Emelda Kirk  Labs: Lab Results  Component Value Date   WBC 11.9* 06/13/2012   HGB 11.7* 06/13/2012   HCT 35.2* 06/13/2012   MCV 75.2* 06/13/2012   PLT 153 06/13/2012    Assessment / Plan: Spontaneous labor, progressing normally  Labor: Progressing normally Preeclampsia:  on magnesium sulfate Fetal Wellbeing:  Category I Pain Control:  Epidural I/D:   Anticipated MOD:  NSVD, will rechk by RN in 30" , expect to push thereafter.  Janice Kirk V 06/13/2012, 9:03 AM

## 2012-06-13 NOTE — MAU Note (Signed)
My water broke at 0045 and i'm having contractions

## 2012-06-14 DIAGNOSIS — O1400 Mild to moderate pre-eclampsia, unspecified trimester: Secondary | ICD-10-CM | POA: Diagnosis present

## 2012-06-14 LAB — COMPREHENSIVE METABOLIC PANEL
Albumin: 2.2 g/dL — ABNORMAL LOW (ref 3.5–5.2)
BUN: 12 mg/dL (ref 6–23)
Chloride: 99 mEq/L (ref 96–112)
Creatinine, Ser: 0.87 mg/dL (ref 0.50–1.10)
GFR calc Af Amer: >90 mL/min (ref >90)
Glucose, Bld: 100 mg/dL — ABNORMAL HIGH (ref 70–99)
Total Bilirubin: 0.5 mg/dL (ref 0.3–1.2)
Total Protein: 5.1 g/dL — ABNORMAL LOW (ref 6.0–8.3)

## 2012-06-14 LAB — CBC
HCT: 26.4 % — ABNORMAL LOW (ref 36.0–46.0)
MCHC: 33.7 g/dL (ref 30.0–36.0)
MCV: 75.9 fL — ABNORMAL LOW (ref 78.0–100.0)
RDW: 13.3 % (ref 11.5–15.5)

## 2012-06-14 MED ORDER — SODIUM CHLORIDE 0.9 % IJ SOLN
3.0000 mL | Freq: Two times a day (BID) | INTRAMUSCULAR | Status: DC
  Administered 2012-06-14: 3 mL via INTRAVENOUS

## 2012-06-14 MED ORDER — SODIUM CHLORIDE 0.9 % IJ SOLN: 3.0000 mL | INTRAMUSCULAR | Status: DC | PRN

## 2012-06-14 MED ORDER — SODIUM CHLORIDE 0.9 % IV SOLN: 250.0000 mL | INTRAVENOUS | Status: DC | PRN

## 2012-06-14 NOTE — Progress Notes (Signed)
UR chart review completed.  

## 2012-06-14 NOTE — Anesthesia Postprocedure Evaluation (Signed)
  Anesthesia Post-op Note  Patient: Janice Kirk  Procedure(s) Performed: * No procedures listed *  Patient Location: 122  Anesthesia Type: Epidural  Level of Consciousness: awake, alert  and oriented  Airway and Oxygen Therapy: Patient Spontanous Breathing  Post-op Pain: mild  Post-op Assessment: Post-op Vital signs reviewed, Patient's Cardiovascular Status Stable, No headache, No backache, No residual numbness and No residual motor weakness  Post-op Vital Signs: Reviewed and stable  Complications: No apparent anesthesia complications

## 2012-06-14 NOTE — Anesthesia Postprocedure Evaluation (Signed)
Anesthesia Post Note  Patient: Janice Kirk  Procedure(s) Performed: * No procedures listed *  Anesthesia type: Epidural  Patient location: Mother/Baby  Post pain: Pain level controlled  Post assessment: Post-op Vital signs reviewed  Last Vitals:  Filed Vitals:   06/14/12 0008  BP: 138/81  Pulse: 112  Temp: 36.9 C  Resp: 16    Post vital signs: Reviewed  Level of consciousness: awake  Complications: No apparent anesthesia complications

## 2012-06-14 NOTE — Progress Notes (Signed)
IV magnesium d/c'd

## 2012-06-14 NOTE — Addendum Note (Signed)
Addendum  created 06/14/12 1634 by Graciela Husbands, CRNA   Modules edited:Notes Section

## 2012-06-14 NOTE — Progress Notes (Signed)
Post Partum Day 1 Subjective: no complaints, voiding, tolerating PO and denies headache, scotoma  Objective: Blood pressure 132/85, pulse 92, temperature 98.2 F (36.8 C), temperature source Oral, resp. rate 18, height 5' 2.5" (1.588 m), weight 67.586 kg (149 lb), SpO2 98.00%, unknown if currently breastfeeding. breast feeding, BC: undecided I&O: Still + Fluid balance, but beginning to diurese over 100/hr  Physical Exam:  General: alert, cooperative and no distress Lochia: appropriate Uterine Fundus: firm Incision: none Reflexes 2+ without clonus.  DVT Evaluation: No evidence of DVT seen on physical exam.   Basename 06/14/12 0450 06/13/12 1130  HGB 8.9* 10.8*  HCT 26.4* 32.9*  plts             147 K                        153K  Assessment/Plan: PPD 1 , with good resolution of Preeclampsia  To stop Mag Sulfate at 11 am Plan for discharge tomorrow, Breastfeeding and Contraception to discuss. Pt likely to use condoms til no longer br feeding, then OCP's   LOS: 1 day   Janice Kirk 06/14/2012, 7:29 AM

## 2012-06-14 NOTE — Progress Notes (Addendum)
Pt transferred ambulatory to RM 122. Update given to Joanna Hews, RN

## 2012-06-14 NOTE — Plan of Care (Signed)
Problem: Phase II Progression Outcomes Goal: Rh isoimmunization per orders Outcome: Not Applicable Date Met:  06/14/12 Mom & baby are both O neg

## 2012-06-15 DIAGNOSIS — IMO0002 Reserved for concepts with insufficient information to code with codable children: Secondary | ICD-10-CM

## 2012-06-15 DIAGNOSIS — O9989 Other specified diseases and conditions complicating pregnancy, childbirth and the puerperium: Secondary | ICD-10-CM

## 2012-06-15 MED ORDER — LABETALOL HCL 200 MG PO TABS
200.0000 mg | ORAL_TABLET | Freq: Two times a day (BID) | ORAL | Status: DC
  Administered 2012-06-15 – 2012-06-16 (×3): 200 mg via ORAL
  Filled 2012-06-15 (×5): qty 1

## 2012-06-15 MED ORDER — HYDROCHLOROTHIAZIDE 12.5 MG PO CAPS
25.0000 mg | ORAL_CAPSULE | Freq: Every day | ORAL | Status: DC
  Filled 2012-06-15 (×2): qty 2

## 2012-06-15 MED ORDER — IBUPROFEN 600 MG PO TABS: 600.0000 mg | ORAL_TABLET | Freq: Four times a day (QID) | ORAL | Status: AC | PRN

## 2012-06-15 MED ORDER — HYDROCHLOROTHIAZIDE 25 MG PO TABS
25.0000 mg | ORAL_TABLET | Freq: Every day | ORAL | Status: DC
  Administered 2012-06-15 – 2012-06-16 (×2): 25 mg via ORAL
  Filled 2012-06-15 (×3): qty 1

## 2012-06-15 NOTE — Discharge Summary (Signed)
Agree with above note.  LEGGETT,KELLY H. 06/15/2012 7:25 AM

## 2012-06-15 NOTE — Discharge Summary (Signed)
Obstetric Discharge Summary3 Reason for Admission: onset of labor Prenatal Procedures: NST and Preeclampsia Intrapartum Procedures: spontaneous vaginal delivery Postpartum Procedures: none Complications-Operative and Postpartum: 1st  degree perineal laceration, periurethral not requiring repair.  Hemoglobin  Date Value Range Status  06/14/2012 8.9* 12.0 - 15.0 g/dL Final     REPEATED TO VERIFY     DELTA CHECK NOTED     HCT  Date Value Range Status  06/14/2012 26.4* 36.0 - 46.0 % Final    Physical Exam:  BP 128/84  Pulse 98  Temp 98.6 F (37 C) (Oral)  Resp 20  Ht 5' 2.5" (1.588 m)  Wt 67.586 kg (149 lb)  BMI 26.82 kg/m2  SpO2 98%  Breastfeeding? Unknown General: alert and cooperative Lochia: appropriate Uterine Fundus: firm DVT Evaluation: No evidence of DVT seen on physical exam. No cords or calf tenderness. Neuro: no clonus; 2+ reflexes  Discharge Diagnoses: Term Pregnancy-delivered and Preelampsia  Janice Kirk 21 y.o. female  G1P1001 who at [redacted]w[redacted]d delivered a healthy female ( left arm was released completely before delivery of the fetal body) via Vaginal, Spontaneous Delivery (Presentation: Right Occiput Anterior) with intact placenta and 3 vessel cord.  GBS positive treated with PCN. At time of admission, mother noted to be in severe range hypertension so magnesium and prn labetalol were started. Pr/Cr ratio came back at 1.56 consistent with preeclampsia so magnesium continued. Blood pressure improved after epidural placed. Magnesium continued 24 hours after delivery. Blood pressures continued to normalize and typically less than 140/90 on day before discharge. Planned follow up in 1 week after discharge for blood pressure check.  Mother plans to breastfeed. Condoms for contraception until 6 week appointment and then likely oral contraceptives.    Discharge Information: Date: 06/15/2012 Activity: pelvic rest Diet: routine Medications: PNV and Ibuprofen Condition:  stable Instructions: refer to practice specific booklet Discharge to: home Follow-up Information    Follow up with Tilda Burrow, MD. Schedule an appointment as soon as possible for a visit in 1 week. (blood pressure follow up since you had preeclampsia)    Contact information:   [facility] [address], [address] [place] [address] [phone]          Newborn Data: Live born female  Birth Weight: 7 lb 8.6 oz (3420 g) APGAR: 9, 9  Home with mother.  HUNTER, STEPHEN 06/15/2012, 7:10 AM

## 2012-06-15 NOTE — Progress Notes (Signed)
Post Partum Day 2  Called by RN d/t bp 156/112 and pt c/o blurred vision and dizziness on routine v/s check. Discharge previously ordered this am. S/P 24 hrs pp magnesium sulfate.  Subjective: Reports 'fuzzy' vision and dizziness.  Denies headache, RUQ or epigastric pain.    Objective: Blood pressure 156/112, pulse 101, temperature 98.3 F (36.8 C), temperature source Oral, resp. rate 20, height 5' 2.5" (1.588 m), weight 67.586 kg (149 lb), SpO2 100.00%, unknown if currently breastfeeding.  Physical Exam:  General: alert, cooperative and no distress Lochia: appropriate Uterine Fundus: firm Incision: n/a DVT Evaluation: No evidence of DVT seen on physical exam. Negative Homan's sign. No cords or calf tenderness. No significant calf/ankle edema.  1+ BLE edema, 2+ DTRs, no clonus   Basename 06/14/12 0450 06/13/12 1130  HGB 8.9* 10.8*  HCT 26.4* 32.9*   Reviewed case & poc w/ Wynelle Bourgeois, CNM & Dr. Macon Large   Assessment/Plan: A: PPD #2 s/p SVD     Pre-eclampsia P: Treat w/ HCTZ 25mg  po & Labetalol 200mg  po now and re-evaluate BP, if has decreased to    <160/105 may d/c home to f/u w/ [facility] on Thurs 8/22    LOS: 2 days   Cheral Marker, CNM, WHNP-BC

## 2012-06-15 NOTE — Progress Notes (Signed)
Patient ID: Janice Kirk, female   DOB: [birth year], 21 y.o.   MRN: [id number]  BPs improved Filed Vitals:   06/15/12 0938 06/15/12 1420 06/15/12 1720 06/15/12 1809  BP: 131/92 156/112 143/92 131/96  Pulse: 90 101  98  Temp: 98 F (36.7 C) 98.3 F (36.8 C)  98.2 F (36.8 C)  TempSrc: Oral Oral  Oral  Resp: 18 20  18   Height:      Weight:      SpO2: 100%   98%    Feels dizzy and still has blurry vision.  Discussed with DR Anyanwu.  Since we started new meds, we will observe her overnight and reevaluate for discharge tomorrow morning.

## 2012-06-16 MED ORDER — HYDROCHLOROTHIAZIDE 25 MG PO TABS: 25.0000 mg | ORAL_TABLET | Freq: Every day | ORAL | Status: DC

## 2012-06-16 MED ORDER — LABETALOL HCL 200 MG PO TABS: 200.0000 mg | ORAL_TABLET | Freq: Two times a day (BID) | ORAL | Status: DC

## 2012-06-16 NOTE — Discharge Summary (Signed)
Attestation of Attending Supervision of Advanced Practitioner (CNM/NP): Evaluation and management procedures were performed by the Advanced Practitioner under my supervision and collaboration.  I have reviewed the Advanced Practitioner's note and chart, and I agree with the management and plan.  Baudelia Schroepfer, MD, FACOG Attending Obstetrician & Gynecologist Faculty Practice, [facility]  

## 2012-06-16 NOTE — Discharge Summary (Signed)
          Obstetric Discharge Summary3  Reason for Admission: onset of labor  Prenatal Procedures: NST and Preeclampsia  Intrapartum Procedures: spontaneous vaginal delivery  Postpartum Procedures: none  Complications-Operative and Postpartum: 1st degree perineal laceration, periurethral not requiring repair.    Hemoglobin    Date  Value  Range  Status    06/14/2012  8.9*  12.0 - 15.0 g/dL  Final    REPEATED TO VERIFY    DELTA CHECK NOTED       HCT    Date  Value  Range  Status    06/14/2012  26.4*  36.0 - 46.0 %  Final     Physical Exam:  Filed Vitals:   06/16/12 0525  BP: 135/84  Pulse: 99  Temp: 97.9 F (36.6 C)  Resp: 20   General: alert and cooperative  Lochia: appropriate  Uterine Fundus: firm  DVT Evaluation: No evidence of DVT seen on physical exam.  No cords or calf tenderness.  Neuro: no clonus; 2+ reflexes  Discharge Diagnoses: Term Pregnancy-delivered and Preelampsia  Janice Kirk 21 y.o. female G1P1001 who at [redacted]w[redacted]d delivered a healthy female ( left arm was released completely before delivery of the fetal body) via Vaginal, Spontaneous Delivery (Presentation: Right Occiput Anterior) with intact placenta and 3 vessel cord. GBS positive treated with PCN. At time of admission, mother noted to be in severe range hypertension so magnesium and prn labetalol were started. Pr/Cr ratio came back at 1.56 consistent with preeclampsia so magnesium continued. Blood pressure improved after epidural placed. Magnesium continued 24 hours after delivery. Blood pressures continued to normalize and typically less than 140/90 on day before discharge. On PPD#2, blood pressure was 156/112 with visual changes and dizziness, so she was started on labetalol 200mg  and HCTZ 25mg  and kept overnight for observation.  On the morning of discharge, her symptoms have improved and BP was 135/84.  Planned follow up in 1 week after discharge for blood pressure check.  Mother plans to breastfeed. Condoms  for contraception until 6 week appointment and then likely oral contraceptives.  Discharge Information:  Date: 06/16/2012  Activity: pelvic rest  Diet: routine  Medications: PNV and Ibuprofen, labetalol 200mg  BID, HCTZ 25mg  Condition: stable  Instructions: refer to practice specific booklet  Discharge to: home    Follow-up Information     Follow up with Tilda Burrow, MD. Schedule an appointment as soon as possible for a visit in 1 week. (blood pressure follow up since you had preeclampsia)     Contact information:     [facility]  [address], [address]  [place] [address]  [phone]            Newborn Data:  Live born female  Birth Weight: 7 lb 8.6 oz (3420 g)  APGAR: 9, 9  Home with mother.      JASON HOYLE, PA-S 06/16/2012, 7:37 AM  I have seen and examined this patient and I agree with the above. Cam Hai 8:54 AM 06/16/2012

## 2013-03-02 ENCOUNTER — Encounter: Payer: Self-pay | Admitting: *Deleted

## 2013-03-02 DIAGNOSIS — IMO0002 Reserved for concepts with insufficient information to code with codable children: Secondary | ICD-10-CM

## 2013-03-02 DIAGNOSIS — R87619 Unspecified abnormal cytological findings in specimens from cervix uteri: Secondary | ICD-10-CM | POA: Insufficient documentation

## 2013-03-02 DIAGNOSIS — J45909 Unspecified asthma, uncomplicated: Secondary | ICD-10-CM

## 2013-03-03 ENCOUNTER — Ambulatory Visit (INDEPENDENT_AMBULATORY_CARE_PROVIDER_SITE_OTHER): Payer: PRIVATE HEALTH INSURANCE | Admitting: Advanced Practice Midwife

## 2013-03-03 ENCOUNTER — Encounter: Payer: Self-pay | Admitting: Advanced Practice Midwife

## 2013-03-03 VITALS — BP 136/90 | Ht 63.0 in | Wt 137.0 lb

## 2013-03-03 DIAGNOSIS — R5381 Other malaise: Secondary | ICD-10-CM

## 2013-03-03 DIAGNOSIS — I10 Essential (primary) hypertension: Secondary | ICD-10-CM

## 2013-03-03 DIAGNOSIS — R5383 Other fatigue: Secondary | ICD-10-CM

## 2013-03-03 DIAGNOSIS — N921 Excessive and frequent menstruation with irregular cycle: Secondary | ICD-10-CM

## 2013-03-03 LAB — TSH: TSH: 3.012 u[IU]/mL (ref 0.350–4.500)

## 2013-03-03 LAB — POCT HEMOGLOBIN: Hemoglobin: 14.7 g/dL (ref 12.2–16.2)

## 2013-03-03 NOTE — Progress Notes (Signed)
Janice Kirk Is a 22 y.o. On [place] since the birth of her child 8/13. SHe has been having BTB  She is not breastfeeding anymore.  Pt has CHTN.  Filed Vitals:   03/03/13 0953  BP: 136/90     Review of Systems   Constitutional: Negative for fever and chills; c/o fatigue since birth of child.  Sleeps well, 7-8 hours undisturbed Eyes: Negative for visual disturbances Respiratory: Negative for shortness of breath, dyspnea Cardiovascular: Negative for chest pain or palpitations  Gastrointestinal: Negative for vomiting, diarrhea and constipation Genitourinary: Negative for dysuria and urgency Musculoskeletal: Negative for back pain, joint pain, myalgias  Neurological: Negative for headaches; positive for dizziness one day this week. Room would spin even when supine.    Physical Exam  Constitutional: She is oriented to person, place, and time and well-developed, well-nourished, and in no distress.  Neck: Normal range of motion. Neck supple.  Cardiovascular: Normal rate and regular rhythm.   Pulmonary/Chest: Effort normal and breath sounds normal.  Abdominal: Soft. There is no tenderness.  Neurological: She is alert and oriented to person, place, and time.  Skin: Skin is warm and dry.  Psychiatric: Affect normal.   Hgb 14.2  ASSESSMENT:  BTB on progestin-only BC pills; Vertigo (resolved)  PLAN:  Because pt is CHTN on meds, it was explained that a progestin-only form of contraception is ideal for her; However, if she wants to stay on pills, LoLoestrin ( ) of estrogen would be the best one for her. Pt will think about Mirena.  Check TSH

## 2013-03-03 NOTE — Patient Instructions (Signed)
Fatigue Fatigue is a feeling of tiredness, lack of energy, lack of motivation, or feeling tired all the time. Having enough rest, good nutrition, and reducing stress will normally reduce fatigue. Consult your caregiver if it persists. The nature of your fatigue will help your caregiver to find out its cause. The treatment is based on the cause.  CAUSES  There are many causes for fatigue. Most of the time, fatigue can be traced to one or more of your habits or routines. Most causes fit into one or more of three general areas. They are: Lifestyle problems  Sleep disturbances.  Overwork.  Physical exertion.  Unhealthy habits.  Poor eating habits or eating disorders.  Alcohol and/or drug use .  Lack of proper nutrition (malnutrition). Psychological problems  Stress and/or anxiety problems.  Depression.  Grief.  Boredom. Medical Problems or Conditions  Anemia.  Pregnancy.  Thyroid gland problems.  Recovery from major surgery.  Continuous pain.  Emphysema or asthma that is not well controlled  Allergic conditions.  Diabetes.  Infections (such as mononucleosis).  Obesity.  Sleep disorders, such as sleep apnea.  Heart failure or other heart-related problems.  Cancer.  Kidney disease.  Liver disease.  Effects of certain medicines such as antihistamines, cough and cold remedies, prescription pain medicines, heart and blood pressure medicines, drugs used for treatment of cancer, and some antidepressants. SYMPTOMS  The symptoms of fatigue include:   Lack of energy.  Lack of drive (motivation).  Drowsiness.  Feeling of indifference to the surroundings. DIAGNOSIS  The details of how you feel help guide your caregiver in finding out what is causing the fatigue. You will be asked about your present and past health condition. It is important to review all medicines that you take, including prescription and non-prescription items. A thorough exam will be done.  You will be questioned about your feelings, habits, and normal lifestyle. Your caregiver may suggest blood tests, urine tests, or other tests to look for common medical causes of fatigue.  TREATMENT  Fatigue is treated by correcting the underlying cause. For example, if you have continuous pain or depression, treating these causes will improve how you feel. Similarly, adjusting the dose of certain medicines will help in reducing fatigue.  HOME CARE INSTRUCTIONS   Try to get the required amount of good sleep every night.  Eat a healthy and nutritious diet, and drink enough water throughout the day.  Practice ways of relaxing (including yoga or meditation).  Exercise regularly.  Make plans to change situations that cause stress. Act on those plans so that stresses decrease over time. Keep your work and personal routine reasonable.  Avoid street drugs and minimize use of alcohol.  Start taking a daily multivitamin after consulting your caregiver. SEEK MEDICAL CARE IF:   You have persistent tiredness, which cannot be accounted for.  You have fever.  You have unintentional weight loss.  You have headaches.  You have disturbed sleep throughout the night.  You are feeling sad.  You have constipation.  You have dry skin.  You have gained weight.  You are taking any new or different medicines that you suspect are causing fatigue.  You are unable to sleep at night.  You develop any unusual swelling of your legs or other parts of your body. SEEK IMMEDIATE MEDICAL CARE IF:   You are feeling confused.  Your vision is blurred.  You feel faint or pass out.  You develop severe headache.  You develop severe abdominal, pelvic, or   back pain.  You develop chest pain, shortness of breath, or an irregular or fast heartbeat.  You are unable to pass a normal amount of urine.  You develop abnormal bleeding such as bleeding from the rectum or you vomit blood.  You have thoughts  about harming yourself or committing suicide.  You are worried that you might harm someone else. MAKE SURE YOU:   Understand these instructions.  Will watch your condition.  Will get help right away if you are not doing well or get worse. Document Released: 08/10/2007 Document Revised: 01/05/2012 Document Reviewed: 08/10/2007 [facility] Patient Information 2013 [place], [place]. Levonorgestrel intrauterine device (IUD) What is this medicine? LEVONORGESTREL IUD (LEE voe nor jes trel) is a contraceptive (birth control) device. The device is placed inside the uterus by a healthcare professional. It is used to prevent pregnancy and can also be used to treat heavy bleeding that occurs during your period. Depending on the device, it can be used for 3 to 5 years. This medicine may be used for other purposes; ask your health care provider or pharmacist if you have questions. What should I tell my health care provider before I take this medicine? They need to know if you have any of these conditions: -abnormal Pap smear -cancer of the breast, uterus, or cervix -diabetes -endometritis -genital or pelvic infection now or in the past -have more than one sexual partner or your partner has more than one partner -heart disease -history of an ectopic or tubal pregnancy -immune system problems -IUD in place -liver disease or tumor -problems with blood clots or take blood-thinners -use intravenous drugs -uterus of unusual shape -vaginal bleeding that has not been explained -an unusual or allergic reaction to levonorgestrel, other hormones, silicone, or polyethylene, medicines, foods, dyes, or preservatives -pregnant or trying to get pregnant -breast-feeding How should I use this medicine? This device is placed inside the uterus by a health care professional. Talk to your pediatrician regarding the use of this medicine in children. Special care may be needed. Overdosage: If you think you have taken  too much of this medicine contact a poison control center or emergency room at once. NOTE: This medicine is only for you. Do not share this medicine with others. What if I miss a dose? This does not apply. What may interact with this medicine? Do not take this medicine with any of the following medications: -amprenavir -bosentan -fosamprenavir This medicine may also interact with the following medications: -aprepitant -barbiturate medicines for inducing sleep or treating seizures -bexarotene -griseofulvin -medicines to treat seizures like carbamazepine, ethotoin, felbamate, oxcarbazepine, phenytoin, topiramate -modafinil -pioglitazone -rifabutin -rifampin -rifapentine -some medicines to treat HIV infection like atazanavir, indinavir, lopinavir, nelfinavir, tipranavir, ritonavir -St. John's wort -warfarin This list may not describe all possible interactions. Give your health care provider a list of all the medicines, herbs, non-prescription drugs, or dietary supplements you use. Also tell them if you smoke, drink alcohol, or use illegal drugs. Some items may interact with your medicine. What should I watch for while using this medicine? Visit your doctor or health care professional for regular check ups. See your doctor if you or your partner has sexual contact with others, becomes HIV positive, or gets a sexual transmitted disease. This product does not protect you against HIV infection (AIDS) or other sexually transmitted diseases. You can check the placement of the IUD yourself by reaching up to the top of your vagina with clean fingers to feel the threads. Do not pull on  the threads. It is a good habit to check placement after each menstrual period. Call your doctor right away if you feel more of the IUD than just the threads or if you cannot feel the threads at all. The IUD may come out by itself. You may become pregnant if the device comes out. If you notice that the IUD has come out  use a backup birth control method like condoms and call your health care provider. Using tampons will not change the position of the IUD and are okay to use during your period. What side effects may I notice from receiving this medicine? Side effects that you should report to your doctor or health care professional as soon as possible: -allergic reactions like skin rash, itching or hives, swelling of the face, lips, or tongue -fever, flu-like symptoms -genital sores -high blood pressure -no menstrual period for 6 weeks during use -pain, swelling, warmth in the leg -pelvic pain or tenderness -severe or sudden headache -signs of pregnancy -stomach cramping -sudden shortness of breath -trouble with balance, talking, or walking -unusual vaginal bleeding, discharge -yellowing of the eyes or skin Side effects that usually do not require medical attention (report to your doctor or health care professional if they continue or are bothersome): -acne -breast pain -change in sex drive or performance -changes in weight -cramping, dizziness, or faintness while the device is being inserted -headache -irregular menstrual bleeding within first 3 to 6 months of use -nausea This list may not describe all possible side effects. Call your doctor for medical advice about side effects. You may report side effects to FDA at 1-800-FDA-1088. Where should I keep my medicine? This does not apply. NOTE: This sheet is a summary. It may not cover all possible information. If you have questions about this medicine, talk to your doctor, pharmacist, or health care provider.  2013, [employer]. (11/13/2011 1:54:04 PM)

## 2013-03-08 ENCOUNTER — Telehealth: Payer: Self-pay | Admitting: Adult Health

## 2013-03-08 NOTE — Telephone Encounter (Signed)
Pt informed TSH WNL.

## 2013-03-09 ENCOUNTER — Other Ambulatory Visit: Payer: Self-pay | Admitting: Obstetrics & Gynecology

## 2013-03-09 ENCOUNTER — Ambulatory Visit (INDEPENDENT_AMBULATORY_CARE_PROVIDER_SITE_OTHER): Payer: PRIVATE HEALTH INSURANCE | Admitting: Obstetrics & Gynecology

## 2013-03-09 ENCOUNTER — Encounter: Payer: Self-pay | Admitting: Advanced Practice Midwife

## 2013-03-09 VITALS — BP 128/80 | Ht 63.0 in | Wt 139.0 lb

## 2013-03-09 DIAGNOSIS — Z30430 Encounter for insertion of intrauterine contraceptive device: Secondary | ICD-10-CM

## 2013-03-09 DIAGNOSIS — Z3202 Encounter for pregnancy test, result negative: Secondary | ICD-10-CM

## 2013-03-09 MED ORDER — ESCITALOPRAM OXALATE 10 MG PO TABS: 10.0000 mg | ORAL_TABLET | Freq: Every day | ORAL | Status: DC

## 2013-03-09 MED ORDER — LEVONORGESTREL 13.5 MG IU IUD: INTRAUTERINE_SYSTEM | Freq: Once | INTRAUTERINE | Status: DC

## 2013-03-09 NOTE — Progress Notes (Signed)
Janice Kirk is a 22 y.o. year old Caucasian female   who presents for placement of a Skyla IUD.  The risks and benefits of the method and placement have been thouroughly reviewed with the patient and all questions were answered.  Specifically the patient is aware of failure rate of 10/998, expulsion of the IUD and of possible perforation.  The patient is aware of irregular bleeding due to the method and understands the incidence of irregular bleeding diminishes with time.  Time out was performed.  A Graves speculum was placed.  The cervix was prepped using Betadine. The uterus was found to be neutral and it sounded to 7 cm.  The cervix was grasped with a tenaculum and the IUD was inserted to 7 cm.  It was pulled back 1 cm and the IUD was disengaged.  The strings were trimmed to 3 cm.  Sonogram was performed and the proper placement of the IUD was verified.  The patient was instructed on signs and symptoms of infection and to check for the strings after each menses or each month.  The patient is to refrain from intercourse for 3 days.   She is still not "feeling herself". After further discussion, [name] is probably depressed.  Her biological mom had severe PPD, and pt is worried that she will "turn into her mom.'  No suicidal or homocidal ideations.  Will try LExapro 10mg .  The patient is scheduled for a return appointment after her first menses or 4 weeks.    CRESENZO-DISHMAN,Dabney Schanz 03/09/2013 10:29 AM

## 2013-03-09 NOTE — Patient Instructions (Signed)
Levonorgestrel intrauterine device (IUD) What is this medicine? LEVONORGESTREL IUD (LEE voe nor jes trel) is a contraceptive (birth control) device. The device is placed inside the uterus by a healthcare professional. It is used to prevent pregnancy and can also be used to treat heavy bleeding that occurs during your period. Depending on the device, it can be used for 3 to 5 years. (YOURS IS 3 YEARS) This medicine may be used for other purposes; ask your health care provider or pharmacist if you have questions. What should I tell my health care provider before I take this medicine? They need to know if you have any of these conditions: -abnormal Pap smear -cancer of the breast, uterus, or cervix -diabetes -endometritis -genital or pelvic infection now or in the past -have more than one sexual partner or your partner has more than one partner -heart disease -history of an ectopic or tubal pregnancy -immune system problems -IUD in place -liver disease or tumor -problems with blood clots or take blood-thinners -use intravenous drugs -uterus of unusual shape -vaginal bleeding that has not been explained -an unusual or allergic reaction to levonorgestrel, other hormones, silicone, or polyethylene, medicines, foods, dyes, or preservatives -pregnant or trying to get pregnant -breast-feeding How should I use this medicine? This device is placed inside the uterus by a health care professional. Talk to your pediatrician regarding the use of this medicine in children. Special care may be needed. Overdosage: If you think you have taken too much of this medicine contact a poison control center or emergency room at once. NOTE: This medicine is only for you. Do not share this medicine with others. What if I miss a dose? This does not apply. What may interact with this medicine? Do not take this medicine with any of the following medications: -amprenavir -bosentan -fosamprenavir This medicine may  also interact with the following medications: -aprepitant -barbiturate medicines for inducing sleep or treating seizures -bexarotene -griseofulvin -medicines to treat seizures like carbamazepine, ethotoin, felbamate, oxcarbazepine, phenytoin, topiramate -modafinil -pioglitazone -rifabutin -rifampin -rifapentine -some medicines to treat HIV infection like atazanavir, indinavir, lopinavir, nelfinavir, tipranavir, ritonavir -St. John's wort -warfarin This list may not describe all possible interactions. Give your health care provider a list of all the medicines, herbs, non-prescription drugs, or dietary supplements you use. Also tell them if you smoke, drink alcohol, or use illegal drugs. Some items may interact with your medicine. What should I watch for while using this medicine? Visit your doctor or health care professional for regular check ups. See your doctor if you or your partner has sexual contact with others, becomes HIV positive, or gets a sexual transmitted disease. This product does not protect you against HIV infection (AIDS) or other sexually transmitted diseases. You can check the placement of the IUD yourself by reaching up to the top of your vagina with clean fingers to feel the threads. Do not pull on the threads. It is a good habit to check placement after each menstrual period. Call your doctor right away if you feel more of the IUD than just the threads or if you cannot feel the threads at all. The IUD may come out by itself. You may become pregnant if the device comes out. If you notice that the IUD has come out use a backup birth control method like condoms and call your health care provider. Using tampons will not change the position of the IUD and are okay to use during your period. What side effects may I notice  from receiving this medicine? Side effects that you should report to your doctor or health care professional as soon as possible: -allergic reactions like skin  rash, itching or hives, swelling of the face, lips, or tongue -fever, flu-like symptoms -genital sores -high blood pressure -no menstrual period for 6 weeks during use -pain, swelling, warmth in the leg -pelvic pain or tenderness -severe or sudden headache -signs of pregnancy -stomach cramping -sudden shortness of breath -trouble with balance, talking, or walking -unusual vaginal bleeding, discharge -yellowing of the eyes or skin Side effects that usually do not require medical attention (report to your doctor or health care professional if they continue or are bothersome): -acne -breast pain -change in sex drive or performance -changes in weight -cramping, dizziness, or faintness while the device is being inserted -headache -irregular menstrual bleeding within first 3 to 6 months of use -nausea This list may not describe all possible side effects. Call your doctor for medical advice about side effects. You may report side effects to FDA at 1-800-FDA-1088. Where should I keep my medicine? This does not apply. NOTE: This sheet is a summary. It may not cover all possible information. If you have questions about this medicine, talk to your doctor, pharmacist, or health care provider.  2013, [employer]. (11/13/2011 1:54:04 PM)

## 2013-03-10 ENCOUNTER — Other Ambulatory Visit: Payer: Self-pay | Admitting: Obstetrics & Gynecology

## 2013-03-10 MED ORDER — ESCITALOPRAM OXALATE 10 MG PO TABS: 10.0000 mg | ORAL_TABLET | Freq: Every day | ORAL | Status: DC

## 2013-04-06 ENCOUNTER — Ambulatory Visit (INDEPENDENT_AMBULATORY_CARE_PROVIDER_SITE_OTHER): Payer: PRIVATE HEALTH INSURANCE | Admitting: Advanced Practice Midwife

## 2013-04-06 ENCOUNTER — Encounter: Payer: Self-pay | Admitting: Advanced Practice Midwife

## 2013-04-06 VITALS — BP 110/80 | Ht 63.0 in | Wt 135.0 lb

## 2013-04-06 DIAGNOSIS — Z30431 Encounter for routine checking of intrauterine contraceptive device: Secondary | ICD-10-CM

## 2013-04-06 NOTE — Progress Notes (Signed)
Janice Kirk 21 y.o. Here 4 weeks after insertion of Skyla IUD.  Has had bleeding every day, moderate amount.  No cramps.    Review of Systems   Constitutional: Negative for fever and chills Eyes: Negative for visual disturbances Respiratory: Negative for shortness of breath, dyspnea Cardiovascular: Negative for chest pain or palpitations  Gastrointestinal: Negative for vomiting, diarrhea and constipation Genitourinary: Negative for dysuria and urgency Musculoskeletal: Negative for back pain, joint pain, myalgias  Neurological: Negative for dizziness and headaches  SSE:  Strings visible at os,  Mod amount menstrual blood  Pt aware that Christean Grief is not designed to stop your period  Pt aware that megace will temporarily stop her bleeding, but declines for now.

## 2013-09-01 ENCOUNTER — Other Ambulatory Visit: Payer: Self-pay

## 2014-03-28 ENCOUNTER — Ambulatory Visit (INDEPENDENT_AMBULATORY_CARE_PROVIDER_SITE_OTHER): Payer: PRIVATE HEALTH INSURANCE | Admitting: Advanced Practice Midwife

## 2014-03-28 ENCOUNTER — Encounter: Payer: Self-pay | Admitting: Advanced Practice Midwife

## 2014-03-28 VITALS — BP 132/70 | Ht 63.0 in | Wt 160.0 lb

## 2014-03-28 DIAGNOSIS — R635 Abnormal weight gain: Secondary | ICD-10-CM

## 2014-03-28 DIAGNOSIS — Z30432 Encounter for removal of intrauterine contraceptive device: Secondary | ICD-10-CM

## 2014-03-28 NOTE — Progress Notes (Signed)
  Janice Kirk 23 y.o.  Filed Vitals:   03/28/14 0855  BP: 132/70   Past Medical History  Diagnosis Date  . Renal disorder     kidney stones  . Pregnant state, incidental   . Asthma   . Abnormal pap    History reviewed. No pertinent past surgical history. family history is not on file. She was adopted. Current outpatient prescriptions:Multiple Vitamins-Calcium (ONE-A-DAY WOMENS PO), Take by mouth daily., Disp: , Rfl: ;  Multiple Vitamins-Minerals (MULTIVITAMIN WITH MINERALS) tablet, Take 1 tablet by mouth daily., Disp: , Rfl:  Current facility-administered medications:Levonorgestrel IUD, , Intrauterine, Once, Christin Fudge, CNM    Here for IUD removal.  She had the Mirena IUD placed 1 year ago and would like it removed because she has gained 25 lbs.  Her plans for future contraception are COC's. Husband is in [employer] in [place] and won't be back until September, so will take the summer off of all birth contraol.  A graves speculum was placed, and the strings were not visible.  They were easily grasped in the cervix with a curved Claiborne Billings and the IUD easily removed.  Pt given IUD removal f/u instructions. Wll call in august for COC's

## 2014-03-29 ENCOUNTER — Encounter: Payer: Self-pay | Admitting: Advanced Practice Midwife

## 2014-03-29 LAB — TSH: TSH: 2.908 u[IU]/mL (ref 0.350–4.500)

## 2014-04-24 ENCOUNTER — Encounter: Payer: Self-pay | Admitting: Family Medicine

## 2014-04-24 ENCOUNTER — Ambulatory Visit (INDEPENDENT_AMBULATORY_CARE_PROVIDER_SITE_OTHER): Payer: PRIVATE HEALTH INSURANCE | Admitting: Family Medicine

## 2014-04-24 VITALS — BP 122/84 | Ht 63.0 in | Wt 154.0 lb

## 2014-04-24 DIAGNOSIS — R300 Dysuria: Secondary | ICD-10-CM

## 2014-04-24 DIAGNOSIS — N200 Calculus of kidney: Secondary | ICD-10-CM

## 2014-04-24 LAB — POCT URINALYSIS DIPSTICK
Spec Grav, UA: 1.01
pH, UA: 6

## 2014-04-24 MED ORDER — ONDANSETRON 4 MG PO TBDP: 4.0000 mg | ORAL_TABLET | Freq: Three times a day (TID) | ORAL | Status: DC | PRN

## 2014-04-24 MED ORDER — HYDROCODONE-ACETAMINOPHEN 5-325 MG PO TABS: ORAL_TABLET | ORAL | Status: DC

## 2014-04-24 MED ORDER — ETODOLAC 400 MG PO TABS: 400.0000 mg | ORAL_TABLET | Freq: Two times a day (BID) | ORAL | Status: DC

## 2014-04-24 NOTE — Progress Notes (Signed)
   Subjective:    Patient ID: Janice Kirk, female    DOB: September 27, 1991, 23 y.o.   MRN: [id number]  HPI  Patient arrives with complaint of probable kidney stone-pain started on one side a week ago and now pain on both sides. Patient states she has a history of this and this is how it always presents.  Patient states she may have gotten a bit dehydrated outside.  Very impressive left flank pain radiating to groin. Now has been mostly replaced by right flank pain radiating to groin. Some dysuria. Possible blood. None currently. No fever no chills no vomiting  Review of Systems No rash no change in bowel habits ROS otherwise negative.    Objective:   Physical Exam  Alert mild malaise. Vital stable. Lungs clear. Heart rare in rhythm. No true CVA tenderness on left somewhat on right. Right lower cautery discomfort to deep palpation.  Urinalysis 2-4 red blood cells per high-power field      Assessment & Plan:  Impression recurrent kidney stones discussed plan screening urine hopefully for stone and ounces. Bring in if she gets it. Anti-inflammatory medicine prescribed. Pain medicine prescribed. Encourage hydration. Rationale discussed. WSL

## 2014-04-24 NOTE — Patient Instructions (Signed)
Kidney Stones °Kidney stones (urolithiasis) are deposits that form inside your kidneys. The intense pain is caused by the stone moving through the urinary tract. When the stone moves, the ureter goes into spasm around the stone. The stone is usually passed in the urine.  °CAUSES  °· A disorder that makes certain neck glands produce too much parathyroid hormone (primary hyperparathyroidism). °· A buildup of uric acid crystals, similar to gout in your joints. °· Narrowing (stricture) of the ureter. °· A kidney obstruction present at birth (congenital obstruction). °· Previous surgery on the kidney or ureters. °· Numerous kidney infections. °SYMPTOMS  °· Feeling sick to your stomach (nauseous). °· Throwing up (vomiting). °· Blood in the urine (hematuria). °· Pain that usually spreads (radiates) to the groin. °· Frequency or urgency of urination. °DIAGNOSIS  °· Taking a history and physical exam. °· Blood or urine tests. °· CT scan. °· Occasionally, an examination of the inside of the urinary bladder (cystoscopy) is performed. °TREATMENT  °· Observation. °· Increasing your fluid intake. °· Extracorporeal shock wave lithotripsy--This is a noninvasive procedure that uses shock waves to break up kidney stones. °· Surgery may be needed if you have severe pain or persistent obstruction. There are various surgical procedures. Most of the procedures are performed with the use of small instruments. Only small incisions are needed to accommodate these instruments, so recovery time is minimized. °The size, location, and chemical composition are all important variables that will determine the proper choice of action for you. Talk to your health care Janice Kirk to better understand your situation so that you will minimize the risk of injury to yourself and your kidney.  °HOME CARE INSTRUCTIONS  °· Drink enough water and fluids to keep your urine clear or pale yellow. This will help you to pass the stone or stone fragments. °· Strain  all urine through the provided strainer. Keep all particulate matter and stones for your health care Janice Kirk to see. The stone causing the pain may be as small as a grain of salt. It is very important to use the strainer each and every time you pass your urine. The collection of your stone will allow your health care Janice Kirk to analyze it and verify that a stone has actually passed. The stone analysis will often identify what you can do to reduce the incidence of recurrences. °· Only take over-the-counter or prescription medicines for pain, discomfort, or fever as directed by your health care Janice Kirk. °· Make a follow-up appointment with your health care Janice Kirk as directed. °· Get follow-up X-rays if required. The absence of pain does not always mean that the stone has passed. It may have only stopped moving. If the urine remains completely obstructed, it can cause loss of kidney function or even complete destruction of the kidney. It is your responsibility to make sure X-rays and follow-ups are completed. Ultrasounds of the kidney can show blockages and the status of the kidney. Ultrasounds are not associated with any radiation and can be performed easily in a matter of minutes. °SEEK MEDICAL CARE IF: °· You experience pain that is progressive and unresponsive to any pain medicine you have been prescribed. °SEEK IMMEDIATE MEDICAL CARE IF:  °· Pain cannot be controlled with the prescribed medicine. °· You have a fever or shaking chills. °· The severity or intensity of pain increases over 18 hours and is not relieved by pain medicine. °· You develop a new onset of abdominal pain. °· You feel faint or pass out. °·   You are unable to urinate. °MAKE SURE YOU:  °· Understand these instructions. °· Will watch your condition. °· Will get help right away if you are not doing well or get worse. °Document Released: 10/13/2005 Document Revised: 06/15/2013 Document Reviewed: 03/16/2013 °[employer]® Patient Information ©2015  [employer]. This information is not intended to replace advice given to you by your health care Janice Kirk. Make sure you discuss any questions you have with your health care Janice Kirk. ° °

## 2014-04-26 DIAGNOSIS — N200 Calculus of kidney: Secondary | ICD-10-CM | POA: Insufficient documentation

## 2014-05-10 ENCOUNTER — Ambulatory Visit (INDEPENDENT_AMBULATORY_CARE_PROVIDER_SITE_OTHER): Payer: PRIVATE HEALTH INSURANCE | Admitting: Family Medicine

## 2014-05-10 ENCOUNTER — Encounter: Payer: Self-pay | Admitting: Family Medicine

## 2014-05-10 ENCOUNTER — Encounter (HOSPITAL_COMMUNITY): Payer: Self-pay

## 2014-05-10 ENCOUNTER — Ambulatory Visit (HOSPITAL_COMMUNITY)
Admission: RE | Admit: 2014-05-10 | Discharge: 2014-05-10 | Disposition: A | Discharge status: Home / Self Care | Payer: PRIVATE HEALTH INSURANCE | Source: Ambulatory Visit | Attending: Family Medicine | Admitting: Family Medicine

## 2014-05-10 VITALS — BP 130/78 | Ht 63.0 in | Wt 154.0 lb

## 2014-05-10 DIAGNOSIS — R109 Unspecified abdominal pain: Secondary | ICD-10-CM | POA: Diagnosis present

## 2014-05-10 DIAGNOSIS — D279 Benign neoplasm of unspecified ovary: Secondary | ICD-10-CM | POA: Diagnosis not present

## 2014-05-10 DIAGNOSIS — N200 Calculus of kidney: Secondary | ICD-10-CM | POA: Insufficient documentation

## 2014-05-10 DIAGNOSIS — N915 Oligomenorrhea, unspecified: Secondary | ICD-10-CM

## 2014-05-10 LAB — POCT URINALYSIS DIPSTICK
Spec Grav, UA: 1.015
pH, UA: 7

## 2014-05-10 LAB — POCT URINE PREGNANCY: PREG TEST UR: NEGATIVE

## 2014-05-10 MED ORDER — HYDROCODONE-ACETAMINOPHEN 5-325 MG PO TABS: ORAL_TABLET | ORAL | Status: DC

## 2014-05-10 NOTE — Progress Notes (Signed)
   Subjective:    Patient ID: Janice Kirk, female    DOB: 1991-05-29, 23 y.o.   MRN: [id number]  HPI Patient arrives to follow up on kidney stone-was seen 04/24/14 but the stone still has not passed.  Patient has a longstanding history of kidney stones.  Severe last night with pain , kept her up No nausea now No sweats/fever/chill No heamturia  Review of Systems Denies fever relates flank pain discomfort. Relates kidney stone pain she's had this from kidney stones off and on since age 51    Objective:   Physical Exam Lungs clear heart regular flank tenderness more left-sided than right-sided abdomen soft extremities no edema urinalysis negative patient does have a history of kidney stones.       Assessment & Plan:  Nephrolithiasis-she's been having ongoing pain discomfort over the past couple weeks. She is trying to pass a sono states the pain is severe hydrocodone for pain plenty of fluids anti-inflammatory when necessary we will go ahead and do a CT exam CT is mandatory given her symptoms may need referral to urology no sign of any infection patient was instructed should the pain become severe fevers chills or other problems followup sooner CT pending

## 2014-05-19 ENCOUNTER — Ambulatory Visit (INDEPENDENT_AMBULATORY_CARE_PROVIDER_SITE_OTHER): Payer: PRIVATE HEALTH INSURANCE | Admitting: Family Medicine

## 2014-05-19 ENCOUNTER — Encounter: Payer: Self-pay | Admitting: Family Medicine

## 2014-05-19 VITALS — BP 130/82 | Resp 18 | Ht 63.0 in | Wt 154.0 lb

## 2014-05-19 DIAGNOSIS — M5430 Sciatica, unspecified side: Secondary | ICD-10-CM

## 2014-05-19 DIAGNOSIS — M5431 Sciatica, right side: Secondary | ICD-10-CM

## 2014-05-19 DIAGNOSIS — R300 Dysuria: Secondary | ICD-10-CM

## 2014-05-19 LAB — POCT URINALYSIS DIPSTICK
RBC UA: POSITIVE
pH, UA: 5

## 2014-05-19 MED ORDER — CHLORZOXAZONE 500 MG PO TABS: 500.0000 mg | ORAL_TABLET | Freq: Three times a day (TID) | ORAL | Status: DC | PRN

## 2014-05-19 MED ORDER — HYDROCODONE-ACETAMINOPHEN 5-325 MG PO TABS: ORAL_TABLET | ORAL | Status: DC

## 2014-05-19 MED ORDER — PREDNISONE 20 MG PO TABS: ORAL_TABLET | ORAL | Status: DC

## 2014-05-19 NOTE — Progress Notes (Signed)
   Subjective:    Patient ID: Janice Kirk, female    DOB: 1991/05/17, 23 y.o.   MRN: [id number]  HPI  Patient is very concerned about her kidney stone. Patient begun to cry stating that she doesn't have the money to pay for a hospital bill.   Patient a CT scan. Result reviewed today and presents the patient. Very tiny stones present in both renal regions. However no stones in the ureters. No dilatation suggestive of active or symptomatic kidney stone.  Reports ongoing back pain. Next  Further history states had substantial sciatica during pregnancy.  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  Now reports 2 types of pains upper mid back pain tight aching worse with certain motions. Also deep toothache E. pain right hip radiating down to right calf.   Review of Systems No vomiting no chest pain no headache no abdominal pain no change in bowel habits ROS otherwise    Objective:   Physical Exam Alert somewhat tearful no major distress. Vital stable lungs clear. Heart regular in rhythm. Positive paraspinal tenderness thoracic and lumbar region right more than left. Right side distinct straight leg raise. Significantly positive. Deep tendon reflexes strength sensation intact distally       Assessment & Plan:  Impression 3 distinct problems #1 right leg sciatica discussed #2 thoracic strain with element lumbar strain and spasm. #3 kidney stones highly doubt etiology to patient's symptomatology at this time rationale discussed. Plan prednisone taper. Hydrocodone when necessary. Assess medicines written. Recheck in 10 days if no significant improvement will need a lumbar MRI to 2 symptomatology. WSL

## 2014-05-20 DIAGNOSIS — M5431 Sciatica, right side: Secondary | ICD-10-CM | POA: Insufficient documentation

## 2014-05-29 ENCOUNTER — Encounter: Payer: Self-pay | Admitting: Family Medicine

## 2014-05-29 ENCOUNTER — Ambulatory Visit (INDEPENDENT_AMBULATORY_CARE_PROVIDER_SITE_OTHER): Payer: PRIVATE HEALTH INSURANCE | Admitting: Family Medicine

## 2014-05-29 VITALS — BP 144/88 | Ht 63.0 in | Wt 156.0 lb

## 2014-05-29 DIAGNOSIS — M545 Low back pain, unspecified: Secondary | ICD-10-CM

## 2014-05-29 DIAGNOSIS — M5430 Sciatica, unspecified side: Secondary | ICD-10-CM

## 2014-05-29 DIAGNOSIS — M5431 Sciatica, right side: Secondary | ICD-10-CM

## 2014-05-29 MED ORDER — ETODOLAC 400 MG PO TABS: 400.0000 mg | ORAL_TABLET | Freq: Two times a day (BID) | ORAL | Status: DC

## 2014-05-29 NOTE — Progress Notes (Signed)
   Subjective:    Patient ID: Janice Kirk, female    DOB: 1991-07-09, 23 y.o.   MRN: [id number]  HPIFollow up on low back pain. Finished taking prednisone. Pt states it helped some. Hydrocodone and muscle relaxer did not help. Pt stopped. She is taking advil for pain.   Patient taking quite a bit of Advil.  History pain since pregnancy. Had sciatica than. Also epidural injection wonders if this is connected.  Continue 6 range right-sided back pain.  Also notes right-sided pain all lipids of the neck which radiates into headaches at times.  Hydrocodone helped a little.  Has just started to exercise regularly no chest pain no shortness of breath no frontal headache no sore throat no abdominal pain ROS otherwise negative  pred helped some but not a lot  Review of Systems See above    Objective:   Physical Exam Alert vitals stable. HEENT normal. Lungs clear. Heart rare rhythm. Positive straight leg raise persists on the right. Low back pain to percussion. Right paraspinal region tender to percussion both lumbar and thoracic and for that matter cervical region also       Assessment & Plan:  Impression #1 severe ongoing sciatica enough to effect patient's quality of life. Time to press on with an MRI. #2 paraspinal pain and tenderness likely represents chronic muscular component plan MRI. If positive will refer to neurosurgeon if negative will refer to physical therapist. Anti-inflammatory medicine Lodine prescribed. WSL

## 2014-06-02 ENCOUNTER — Ambulatory Visit (HOSPITAL_COMMUNITY): Payer: PRIVATE HEALTH INSURANCE

## 2014-08-11 ENCOUNTER — Other Ambulatory Visit: Payer: Self-pay

## 2014-08-28 ENCOUNTER — Encounter: Payer: Self-pay | Admitting: Family Medicine

## 2014-10-16 ENCOUNTER — Encounter: Payer: Self-pay | Admitting: Family Medicine

## 2014-10-16 ENCOUNTER — Ambulatory Visit (INDEPENDENT_AMBULATORY_CARE_PROVIDER_SITE_OTHER): Payer: PRIVATE HEALTH INSURANCE | Admitting: Family Medicine

## 2014-10-16 VITALS — BP 128/80 | Temp 98.5°F | Ht 63.0 in | Wt 145.4 lb

## 2014-10-16 DIAGNOSIS — M5440 Lumbago with sciatica, unspecified side: Secondary | ICD-10-CM
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
## 2014-10-25 ENCOUNTER — Ambulatory Visit (HOSPITAL_COMMUNITY)
Admission: RE | Admit: 2014-10-25 | Discharge: 2014-10-25 | Disposition: A | Discharge status: Home / Self Care | Payer: PRIVATE HEALTH INSURANCE | Source: Ambulatory Visit | Attending: Family Medicine | Admitting: Family Medicine

## 2014-10-25 ENCOUNTER — Other Ambulatory Visit: Payer: Self-pay

## 2014-10-25 DIAGNOSIS — M79606 Pain in leg, unspecified: Secondary | ICD-10-CM | POA: Diagnosis present

## 2014-10-25 DIAGNOSIS — M8938 Hypertrophy of bone, other site: Secondary | ICD-10-CM | POA: Diagnosis not present

## 2014-10-25 DIAGNOSIS — M5440 Lumbago with sciatica, unspecified side: Secondary | ICD-10-CM

## 2014-10-25 DIAGNOSIS — M79604 Pain in right leg: Secondary | ICD-10-CM | POA: Insufficient documentation

## 2014-10-25 DIAGNOSIS — M545 Low back pain: Secondary | ICD-10-CM | POA: Insufficient documentation

## 2014-10-25 DIAGNOSIS — M79605 Pain in left leg: Secondary | ICD-10-CM | POA: Insufficient documentation

## 2014-10-25 DIAGNOSIS — R200 Anesthesia of skin: Secondary | ICD-10-CM | POA: Insufficient documentation

## 2014-12-08 ENCOUNTER — Encounter: Payer: Self-pay | Admitting: Family Medicine

## 2015-01-16 ENCOUNTER — Encounter: Payer: Self-pay | Admitting: *Deleted

## 2016-12-15 ENCOUNTER — Encounter: Payer: Self-pay | Admitting: Family Medicine

## 2016-12-15 ENCOUNTER — Ambulatory Visit (INDEPENDENT_AMBULATORY_CARE_PROVIDER_SITE_OTHER): Admitting: Family Medicine

## 2016-12-15 ENCOUNTER — Other Ambulatory Visit (HOSPITAL_COMMUNITY)
Admission: AD | Admit: 2016-12-15 | Discharge: 2016-12-15 | Disposition: A | Discharge status: Home / Self Care | Source: Skilled Nursing Facility | Attending: Family Medicine | Admitting: Family Medicine

## 2016-12-15 VITALS — BP 120/70 | HR 108 | Temp 98.7°F | Resp 16 | Ht 63.0 in | Wt 130.0 lb

## 2016-12-15 DIAGNOSIS — F411 Generalized anxiety disorder: Secondary | ICD-10-CM

## 2016-12-15 DIAGNOSIS — Z23 Encounter for immunization: Secondary | ICD-10-CM

## 2016-12-15 DIAGNOSIS — R350 Frequency of micturition: Secondary | ICD-10-CM | POA: Diagnosis present

## 2016-12-15 DIAGNOSIS — R87610 Atypical squamous cells of undetermined significance on cytologic smear of cervix (ASC-US): Secondary | ICD-10-CM

## 2016-12-15 DIAGNOSIS — Z7689 Persons encountering health services in other specified circumstances: Secondary | ICD-10-CM | POA: Diagnosis not present

## 2016-12-15 LAB — POCT URINALYSIS DIPSTICK
BILIRUBIN UA: NEGATIVE
GLUCOSE UA: NEGATIVE
Ketones, UA: NEGATIVE
LEUKOCYTES UA: NEGATIVE
NITRITE UA: POSITIVE
Protein, UA: NEGATIVE
Spec Grav, UA: 1.015
UROBILINOGEN UA: 0.2
pH, UA: 6

## 2016-12-15 MED ORDER — SULFAMETHOXAZOLE-TRIMETHOPRIM 800-160 MG PO TABS: 1.0000 | ORAL_TABLET | Freq: Two times a day (BID) | ORAL | 0 refills | Status: DC
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
## 2016-12-15 NOTE — Patient Instructions (Addendum)
Need to follow up with Dr Glo Herring  Drink more water  STOP SMOKING  Septra DS twice a day for 3 days  Exercise every day that you are able

## 2016-12-17 LAB — URINE CULTURE

## 2017-01-12 ENCOUNTER — Encounter: Payer: Self-pay | Admitting: Family Medicine

## 2017-01-12 ENCOUNTER — Ambulatory Visit (INDEPENDENT_AMBULATORY_CARE_PROVIDER_SITE_OTHER): Admitting: Family Medicine

## 2017-01-12 VITALS — BP 136/74 | HR 112 | Temp 98.3°F | Resp 16 | Ht 63.0 in | Wt 128.1 lb

## 2017-01-12 DIAGNOSIS — F4310 Post-traumatic stress disorder, unspecified: Secondary | ICD-10-CM

## 2017-01-12 MED ORDER — ALPRAZOLAM 0.5 MG PO TABS: 0.5000 mg | ORAL_TABLET | Freq: Every evening | ORAL | 0 refills | Status: DC | PRN

## 2017-01-12 MED ORDER — SERTRALINE HCL 25 MG PO TABS: 25.0000 mg | ORAL_TABLET | Freq: Every day | ORAL | 1 refills | Status: DC
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
## 2017-01-12 NOTE — Patient Instructions (Addendum)
I will help you get a specialist to talk to  See me in another month Call sooner for problems Take the sertraline once a day in the morning Take two a day after one week Take the alprazolam as needed for panic May take if unable to sleep

## 2017-02-03 ENCOUNTER — Ambulatory Visit: Admitting: Family Medicine

## 2017-02-05 ENCOUNTER — Ambulatory Visit (INDEPENDENT_AMBULATORY_CARE_PROVIDER_SITE_OTHER): Admitting: Family Medicine

## 2017-02-05 ENCOUNTER — Encounter: Payer: Self-pay | Admitting: Family Medicine

## 2017-02-05 VITALS — BP 130/82 | HR 124 | Temp 98.0°F | Resp 16 | Ht 63.0 in | Wt 127.0 lb

## 2017-02-05 DIAGNOSIS — R000 Tachycardia, unspecified: Secondary | ICD-10-CM

## 2017-02-05 DIAGNOSIS — Z720 Tobacco use: Secondary | ICD-10-CM

## 2017-02-05 DIAGNOSIS — F4310 Post-traumatic stress disorder, unspecified: Secondary | ICD-10-CM | POA: Diagnosis not present

## 2017-02-05 HISTORY — DX: Tobacco use: Z72.0

## 2017-02-05 LAB — CBC
HEMATOCRIT: 38.4 % (ref 35.0–45.0)
Hemoglobin: 12.7 g/dL (ref 11.7–15.5)
MCH: 28.5 pg (ref 27.0–33.0)
MCHC: 33.1 g/dL (ref 32.0–36.0)
MCV: 86.1 fL (ref 80.0–100.0)
MPV: 9.9 fL (ref 7.5–12.5)
Platelets: 159 10*3/uL (ref 140–400)
RBC: 4.46 MIL/uL (ref 3.80–5.10)
RDW: 14 % (ref 11.0–15.0)
WBC: 4.7 10*3/uL (ref 3.8–10.8)

## 2017-02-05 MED ORDER — ALPRAZOLAM 0.5 MG PO TABS: 0.5000 mg | ORAL_TABLET | Freq: Two times a day (BID) | ORAL | 0 refills | Status: DC | PRN

## 2017-02-05 MED ORDER — ALPRAZOLAM 0.5 MG PO TABS: 0.5000 mg | ORAL_TABLET | Freq: Every evening | ORAL | 0 refills | Status: DC | PRN

## 2017-02-05 MED ORDER — SERTRALINE HCL 50 MG PO TABS: 50.0000 mg | ORAL_TABLET | Freq: Every day | ORAL | 3 refills | Status: DC

## 2017-02-05 NOTE — Patient Instructions (Signed)
Walk every day that you are able Take the sertraline 50 mg a day Take the alprazolam as needed See me in one month Try to cut down on the smoking

## 2017-02-05 NOTE — Progress Notes (Signed)
Chief Complaint  Patient presents with  . Follow-up    1 month   Patient is here sooner than her scheduled appointment. She came in sooner because she ran out of Xanax. She's been taking one at night and then a half, during the day as needed. She has found this very helpful for her anxiety. Unfortunately, as I discussed with her at her prior appointment, Xanax is not a good long-term medicine is not something I will leave her on. She is taking the sertraline 25 mg a day at first, then 50 mg a day for the last week. She does feel like this is helping. She is regaining a feeling of control and is not as easily upset. I have told her I will give her one more prescription for Xanax to help her with her transition to the full dose of SSRI and then this will be discontinued or used on a very limited basis when necessary panic Since I saw her last her grandfather has died. This is been painful and difficult for her. She identifies this as one of the reasons she used more Xanax than normal. This is understandable. She continues to have tachycardia. I have thought this because of her anxiety. She states at times her heart beats really fast. I worry that she may have hyperthyroidism or another medical condition. She does agree to get some blood work today. She has a friend in her neighborhood and they bought a Scientist, water quality. She is walking every day. She has found this to be very helpful for her. She enjoys exercise as well as the talking. She is eating well. Her weight is stable. Her sleep is improved on the alprazolam.  Patient Active Problem List   Diagnosis Date Noted  . Tobacco abuse 02/05/2017  . Post traumatic stress disorder (PTSD) 01/12/2017  . GAD (generalized anxiety disorder) 12/15/2016  . Nephrolithiasis 04/26/2014  . Asthma 03/02/2013  . Abnormal Pap smear of cervix 03/02/2013    Outpatient Encounter Prescriptions as of 02/05/2017  Medication Sig  . ALPRAZolam (XANAX) 0.5 MG tablet  Take 1 tablet (0.5 mg total) by mouth 2 (two) times daily as needed for anxiety.  . sertraline (ZOLOFT) 50 MG tablet Take 1 tablet (50 mg total) by mouth daily.   No facility-administered encounter medications on file as of 02/05/2017.     No Known Allergies  Review of Systems  Constitutional: Negative for activity change, appetite change, fatigue and unexpected weight change.  Respiratory: Negative for chest tightness and shortness of breath.   Cardiovascular: Negative for chest pain and palpitations.  Gastrointestinal: Negative for constipation and diarrhea.  Genitourinary: Negative for difficulty urinating and menstrual problem.  Psychiatric/Behavioral: Positive for dysphoric mood. The patient is nervous/anxious.     BP 130/82 (BP Location: Right Arm, Patient Position: Sitting, Cuff Size: Normal)   Pulse (!) 124   Temp 98 F (36.7 C) (Temporal)   Resp 16   Ht 5\' 3"  (1.6 m)   Wt 127 lb (57.6 kg)   LMP 01/16/2017   SpO2 99%   BMI 22.50 kg/m   Physical Exam  Constitutional: She is oriented to person, place, and time. She appears well-developed and well-nourished.  HENT:  Head: Normocephalic and atraumatic.  Mouth/Throat: Oropharynx is clear and moist.  Eyes: Conjunctivae are normal. Pupils are equal, round, and reactive to light.  Neck: Normal range of motion. Neck supple. No thyromegaly present.  Cardiovascular: Regular rhythm and normal heart sounds.   Tachycardia  Pulmonary/Chest: Effort normal and breath sounds normal. No respiratory distress.  Musculoskeletal: Normal range of motion. She exhibits no edema.  Lymphadenopathy:    She has no cervical adenopathy.  Neurological: She is alert and oriented to person, place, and time. She displays normal reflexes.  Gait normal  Skin: Skin is warm and dry.  Tobacco stained fingers  Psychiatric: Thought content normal.  Nervous, tremulous- improved  Nursing note and vitals reviewed.   ASSESSMENT/PLAN:  1. Post traumatic  stress disorder (PTSD) - TSH - CBC - COMPLETE METABOLIC PANEL WITH GFR - Lipid panel  2. Tachycardia  3. Tobacco abuse  Greater than 50% of this visit was spent in counseling and coordinating care.  Total face to face time:  25 min.  Discussed depression, anxiety, diet, exercise, sleep.  Medication use.   Patient Instructions  Walk every day that you are able Take the sertraline 50 mg a day Take the alprazolam as needed See me in one month Try to cut down on the smoking   Raylene Everts, MD

## 2017-02-06 LAB — COMPLETE METABOLIC PANEL WITH GFR
ALBUMIN: 4.3 g/dL (ref 3.6–5.1)
ALK PHOS: 50 U/L (ref 33–115)
ALT: 9 U/L (ref 6–29)
AST: 15 U/L (ref 10–30)
BILIRUBIN TOTAL: 0.4 mg/dL (ref 0.2–1.2)
BUN: 7 mg/dL (ref 7–25)
CALCIUM: 9.3 mg/dL (ref 8.6–10.2)
CO2: 28 mmol/L (ref 20–31)
CREATININE: 0.79 mg/dL (ref 0.50–1.10)
Chloride: 103 mmol/L (ref 98–110)
Glucose, Bld: 94 mg/dL (ref 65–99)
Potassium: 4.2 mmol/L (ref 3.5–5.3)
Sodium: 139 mmol/L (ref 135–146)
TOTAL PROTEIN: 6.7 g/dL (ref 6.1–8.1)

## 2017-02-06 LAB — LIPID PANEL
Cholesterol: 115 mg/dL (ref <200)
HDL: 36 mg/dL — AB (ref >50)
LDL CALC: 53 mg/dL (ref <100)
TRIGLYCERIDES: 132 mg/dL (ref <150)
Total CHOL/HDL Ratio: 3.2 Ratio (ref <5.0)
VLDL: 26 mg/dL (ref <30)

## 2017-02-06 LAB — TSH: TSH: 2.07 mIU/L

## 2017-02-09 ENCOUNTER — Telehealth: Payer: Self-pay | Admitting: Family Medicine

## 2017-02-09 NOTE — Telephone Encounter (Signed)
Reassure her that it is all normal.

## 2017-02-09 NOTE — Telephone Encounter (Signed)
Please change pcp to dr nelson 

## 2017-02-09 NOTE — Telephone Encounter (Signed)
Patient was in the office on 04/12 and had some blood work done and is calling to speak with [name].  She's hoping you can interpret the results for her.  cb  [phone]

## 2017-02-10 NOTE — Telephone Encounter (Signed)
Pt aware.

## 2017-02-13 ENCOUNTER — Ambulatory Visit: Admitting: Family Medicine

## 2017-03-09 ENCOUNTER — Encounter: Payer: Self-pay | Admitting: Family Medicine

## 2017-03-09 ENCOUNTER — Telehealth: Payer: Self-pay | Admitting: Family Medicine

## 2017-03-09 ENCOUNTER — Ambulatory Visit (INDEPENDENT_AMBULATORY_CARE_PROVIDER_SITE_OTHER): Admitting: Family Medicine

## 2017-03-09 VITALS — BP 132/74 | HR 104 | Temp 98.2°F | Resp 16 | Ht 63.0 in | Wt 122.0 lb

## 2017-03-09 DIAGNOSIS — F411 Generalized anxiety disorder: Secondary | ICD-10-CM | POA: Diagnosis not present

## 2017-03-09 DIAGNOSIS — R109 Unspecified abdominal pain: Secondary | ICD-10-CM | POA: Diagnosis not present

## 2017-03-09 LAB — POCT URINALYSIS DIPSTICK
Bilirubin, UA: NEGATIVE
Glucose, UA: NEGATIVE
Ketones, UA: NEGATIVE
Leukocytes, UA: NEGATIVE
Nitrite, UA: NEGATIVE
Protein, UA: NEGATIVE
SPEC GRAV UA: 1.025 (ref 1.010–1.025)
UROBILINOGEN UA: 0.2 U/dL
pH, UA: 6 (ref 5.0–8.0)

## 2017-03-09 MED ORDER — ESCITALOPRAM OXALATE 20 MG PO TABS: ORAL_TABLET | ORAL | 2 refills | Status: DC

## 2017-03-09 NOTE — Progress Notes (Signed)
Chief Complaint  Patient presents with  . Follow-up    1 month   Routine follow up Unfortunately has another significant life stress to report States she got a letter from her husband asking for a divorce. Cant leave the house, tight in her chest.  Stopped her own zoloft - didn't work Has not called for a Social worker. Discussed the treatment of choice for anxiety and panic and PTSD is a SSRI,  And I would like for her to try another. She has bilateral low back - flank pain.  No urinary symptoms.   Patient Active Problem List   Diagnosis Date Noted  . Tobacco abuse 02/05/2017  . Post traumatic stress disorder (PTSD) 01/12/2017  . GAD (generalized anxiety disorder) 12/15/2016  . Nephrolithiasis 04/26/2014  . Asthma 03/02/2013  . Abnormal Pap smear of cervix 03/02/2013    Outpatient Encounter Prescriptions as of 03/09/2017  Medication Sig  . ALPRAZolam (XANAX) 0.5 MG tablet Take 1 tablet (0.5 mg total) by mouth 2 (two) times daily as needed for anxiety.  Marland Kitchen escitalopram (LEXAPRO) 20 MG tablet Take one half a day for 8 days then a whole tab daily  . [DISCONTINUED] sertraline (ZOLOFT) 50 MG tablet Take 1 tablet (50 mg total) by mouth daily. (Patient not taking: Reported on 03/09/2017)   No facility-administered encounter medications on file as of 03/09/2017.     No Known Allergies  Review of Systems  Constitutional: Negative for activity change, appetite change and unexpected weight change.  HENT: Negative for congestion, dental problem, postnasal drip and rhinorrhea.   Eyes: Negative for redness and visual disturbance.  Respiratory: Negative for cough and shortness of breath.   Cardiovascular: Negative for chest pain, palpitations and leg swelling.  Gastrointestinal: Negative for abdominal pain, constipation and diarrhea.  Genitourinary: Positive for flank pain. Negative for difficulty urinating, frequency and menstrual problem.  Musculoskeletal: Negative for arthralgias and  back pain.  Neurological: Negative for dizziness and headaches.  Psychiatric/Behavioral: Positive for behavioral problems, dysphoric mood and sleep disturbance. Negative for self-injury and suicidal ideas. The patient is nervous/anxious.    BP 132/74 (BP Location: Right Arm, Patient Position: Sitting, Cuff Size: Normal)   Pulse (!) 104   Temp 98.2 F (36.8 C) (Temporal)   Resp 16   Ht 5\' 3"  (1.6 m)   Wt 122 lb 0.6 oz (55.4 kg)   LMP 02/18/2017   SpO2 99%   BMI 21.62 kg/m   Physical Exam  Constitutional: She is oriented to person, place, and time. She appears well-developed and well-nourished. She appears distressed.  tearful  HENT:  Head: Normocephalic and atraumatic.  Nose: Nose normal.  Cardiovascular: Normal rate, regular rhythm and normal heart sounds.   Pulmonary/Chest: Effort normal and breath sounds normal.  Abdominal: Soft. Bowel sounds are normal. There is no tenderness.  Musculoskeletal: Normal range of motion. She exhibits no edema.  Tender bilat lumbar muscles  Neurological: She is alert and oriented to person, place, and time.  Skin: Skin is warm and dry.  Psychiatric: Thought content normal. Her mood appears anxious. Her affect is angry and labile. Her speech is delayed. She is withdrawn. Cognition and memory are normal. She exhibits a depressed mood.    ASSESSMENT/PLAN:  1. GAD (generalized anxiety disorder) Discussed medicine.  Daily walk.  counselor is strongly recommended.  2. Flank pain Mechanical back pain - POCT urinalysis dipstick   Patient Instructions  Take the escitalopram as directed This is for anxiety Start with 10 mg a  day Then increase to 20 mg a day This will slowly take effect Let me know if there are problems Need to see a counselor See me in 4-6 weeks    Raylene Everts, MD

## 2017-03-09 NOTE — Patient Instructions (Signed)
Take the escitalopram as directed This is for anxiety Start with 10 mg a day Then increase to 20 mg a day This will slowly take effect Let me know if there are problems Need to see a counselor See me in 4-6 weeks

## 2017-03-09 NOTE — Telephone Encounter (Signed)
Patient is requesting refill for xanex Uses [facility] Cb#: [phone]

## 2017-03-10 NOTE — Telephone Encounter (Signed)
No.  I was clear that when I prescribed it , it was short term and would not be continued

## 2017-03-10 NOTE — Telephone Encounter (Signed)
Called Janice Kirk, aware xanax will not be refilled.

## 2017-03-26 ENCOUNTER — Encounter (HOSPITAL_COMMUNITY): Payer: Self-pay | Admitting: Psychiatry

## 2017-03-26 ENCOUNTER — Ambulatory Visit (INDEPENDENT_AMBULATORY_CARE_PROVIDER_SITE_OTHER): Admitting: Psychiatry

## 2017-03-26 VITALS — BP 134/84 | HR 95 | Resp 16 | Ht 63.11 in | Wt 121.8 lb

## 2017-03-26 DIAGNOSIS — Z813 Family history of other psychoactive substance abuse and dependence: Secondary | ICD-10-CM

## 2017-03-26 DIAGNOSIS — F17210 Nicotine dependence, cigarettes, uncomplicated: Secondary | ICD-10-CM | POA: Diagnosis not present

## 2017-03-26 DIAGNOSIS — F4310 Post-traumatic stress disorder, unspecified: Secondary | ICD-10-CM | POA: Diagnosis not present

## 2017-03-26 DIAGNOSIS — Z811 Family history of alcohol abuse and dependence: Secondary | ICD-10-CM | POA: Diagnosis not present

## 2017-03-26 MED ORDER — ALPRAZOLAM 0.5 MG PO TABS: 0.5000 mg | ORAL_TABLET | Freq: Two times a day (BID) | ORAL | 2 refills | Status: DC | PRN

## 2017-03-26 MED ORDER — TRAZODONE HCL 50 MG PO TABS: 50.0000 mg | ORAL_TABLET | Freq: Every day | ORAL | 2 refills | Status: DC
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
## 2017-04-10 ENCOUNTER — Ambulatory Visit (INDEPENDENT_AMBULATORY_CARE_PROVIDER_SITE_OTHER): Admitting: Psychiatry

## 2017-04-10 ENCOUNTER — Encounter (HOSPITAL_COMMUNITY): Payer: Self-pay | Admitting: Psychiatry

## 2017-04-10 VITALS — BP 100/80 | HR 103 | Ht 63.11 in | Wt 119.8 lb

## 2017-04-10 DIAGNOSIS — Z79899 Other long term (current) drug therapy: Secondary | ICD-10-CM

## 2017-04-10 DIAGNOSIS — F17210 Nicotine dependence, cigarettes, uncomplicated: Secondary | ICD-10-CM | POA: Diagnosis not present

## 2017-04-10 DIAGNOSIS — Z814 Family history of other substance abuse and dependence: Secondary | ICD-10-CM | POA: Diagnosis not present

## 2017-04-10 DIAGNOSIS — F4310 Post-traumatic stress disorder, unspecified: Secondary | ICD-10-CM | POA: Diagnosis not present

## 2017-04-10 DIAGNOSIS — Z888 Allergy status to other drugs, medicaments and biological substances status: Secondary | ICD-10-CM | POA: Diagnosis not present

## 2017-04-10 DIAGNOSIS — Z811 Family history of alcohol abuse and dependence: Secondary | ICD-10-CM | POA: Diagnosis not present

## 2017-04-10 MED ORDER — TEMAZEPAM 15 MG PO CAPS: 15.0000 mg | ORAL_CAPSULE | Freq: Every day | ORAL | 2 refills | Status: DC
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
## 2017-04-14 ENCOUNTER — Ambulatory Visit: Admitting: Family Medicine

## 2017-04-16 ENCOUNTER — Ambulatory Visit (HOSPITAL_COMMUNITY): Payer: Self-pay | Admitting: Psychiatry

## 2017-04-28 ENCOUNTER — Ambulatory Visit (INDEPENDENT_AMBULATORY_CARE_PROVIDER_SITE_OTHER): Admitting: Psychiatry

## 2017-04-28 ENCOUNTER — Encounter (HOSPITAL_COMMUNITY): Payer: Self-pay | Admitting: Psychiatry

## 2017-04-28 VITALS — BP 120/82 | HR 90 | Ht 63.11 in | Wt 120.0 lb

## 2017-04-28 DIAGNOSIS — F4310 Post-traumatic stress disorder, unspecified: Secondary | ICD-10-CM | POA: Diagnosis not present

## 2017-04-28 DIAGNOSIS — Z813 Family history of other psychoactive substance abuse and dependence: Secondary | ICD-10-CM | POA: Diagnosis not present

## 2017-04-28 DIAGNOSIS — Z79899 Other long term (current) drug therapy: Secondary | ICD-10-CM | POA: Diagnosis not present

## 2017-04-28 DIAGNOSIS — F419 Anxiety disorder, unspecified: Secondary | ICD-10-CM | POA: Diagnosis not present

## 2017-04-28 DIAGNOSIS — F17210 Nicotine dependence, cigarettes, uncomplicated: Secondary | ICD-10-CM

## 2017-04-28 DIAGNOSIS — G4700 Insomnia, unspecified: Secondary | ICD-10-CM | POA: Diagnosis not present

## 2017-04-28 DIAGNOSIS — Z811 Family history of alcohol abuse and dependence: Secondary | ICD-10-CM

## 2017-04-28 MED ORDER — PAROXETINE HCL 20 MG PO TABS: 20.0000 mg | ORAL_TABLET | Freq: Every day | ORAL | 2 refills | Status: DC

## 2017-04-28 MED ORDER — ALPRAZOLAM 0.5 MG PO TABS: 0.5000 mg | ORAL_TABLET | Freq: Two times a day (BID) | ORAL | 2 refills | Status: DC | PRN
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
## 2017-05-13 ENCOUNTER — Ambulatory Visit (HOSPITAL_COMMUNITY): Payer: Self-pay | Admitting: Psychiatry

## 2017-05-19 ENCOUNTER — Ambulatory Visit: Admitting: Family Medicine

## 2017-05-21 ENCOUNTER — Emergency Department (HOSPITAL_COMMUNITY)
Admission: EM | Admit: 2017-05-21 | Discharge: 2017-05-21 | Disposition: A | Discharge status: Home / Self Care | Attending: Emergency Medicine | Admitting: Emergency Medicine

## 2017-05-21 ENCOUNTER — Emergency Department (HOSPITAL_COMMUNITY)

## 2017-05-21 ENCOUNTER — Encounter (HOSPITAL_COMMUNITY): Payer: Self-pay | Admitting: Emergency Medicine

## 2017-05-21 DIAGNOSIS — Y929 Unspecified place or not applicable: Secondary | ICD-10-CM | POA: Diagnosis not present

## 2017-05-21 DIAGNOSIS — Z79899 Other long term (current) drug therapy: Secondary | ICD-10-CM | POA: Diagnosis not present

## 2017-05-21 DIAGNOSIS — F17210 Nicotine dependence, cigarettes, uncomplicated: Secondary | ICD-10-CM | POA: Diagnosis not present

## 2017-05-21 DIAGNOSIS — S0990XA Unspecified injury of head, initial encounter: Secondary | ICD-10-CM

## 2017-05-21 DIAGNOSIS — W228XXA Striking against or struck by other objects, initial encounter: Secondary | ICD-10-CM | POA: Insufficient documentation

## 2017-05-21 DIAGNOSIS — J45909 Unspecified asthma, uncomplicated: Secondary | ICD-10-CM | POA: Diagnosis not present

## 2017-05-21 DIAGNOSIS — Y939 Activity, unspecified: Secondary | ICD-10-CM | POA: Insufficient documentation

## 2017-05-21 DIAGNOSIS — Y999 Unspecified external cause status: Secondary | ICD-10-CM | POA: Diagnosis not present

## 2017-05-21 DIAGNOSIS — S098XXA Other specified injuries of head, initial encounter: Secondary | ICD-10-CM | POA: Diagnosis not present

## 2017-05-21 MED ORDER — ONDANSETRON 4 MG PO TBDP
4.0000 mg | ORAL_TABLET | Freq: Once | ORAL | Status: AC
  Administered 2017-05-21: 4 mg via ORAL
  Filled 2017-05-21: qty 1
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
## 2017-05-21 NOTE — Discharge Instructions (Signed)
Take Tylenol or Motrin for pain and follow-up with her family doctor if any problems
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
## 2017-05-26 ENCOUNTER — Encounter (HOSPITAL_COMMUNITY): Payer: Self-pay | Admitting: Psychiatry

## 2017-05-26 ENCOUNTER — Telehealth (HOSPITAL_COMMUNITY): Payer: Self-pay | Admitting: *Deleted

## 2017-05-26 ENCOUNTER — Ambulatory Visit (INDEPENDENT_AMBULATORY_CARE_PROVIDER_SITE_OTHER): Admitting: Psychiatry

## 2017-05-26 VITALS — BP 112/69 | HR 86 | Ht 62.5 in | Wt 123.2 lb

## 2017-05-26 DIAGNOSIS — F17210 Nicotine dependence, cigarettes, uncomplicated: Secondary | ICD-10-CM

## 2017-05-26 DIAGNOSIS — F4310 Post-traumatic stress disorder, unspecified: Secondary | ICD-10-CM

## 2017-05-26 DIAGNOSIS — Z813 Family history of other psychoactive substance abuse and dependence: Secondary | ICD-10-CM

## 2017-05-26 DIAGNOSIS — Z811 Family history of alcohol abuse and dependence: Secondary | ICD-10-CM | POA: Diagnosis not present

## 2017-05-26 DIAGNOSIS — G4700 Insomnia, unspecified: Secondary | ICD-10-CM

## 2017-05-26 MED ORDER — TRAZODONE HCL 100 MG PO TABS: 100.0000 mg | ORAL_TABLET | Freq: Every day | ORAL | 2 refills | Status: DC

## 2017-05-26 MED ORDER — ALPRAZOLAM 0.5 MG PO TABS: 0.5000 mg | ORAL_TABLET | Freq: Three times a day (TID) | ORAL | 2 refills | Status: DC | PRN

## 2017-05-26 MED ORDER — PAROXETINE HCL 20 MG PO TABS: 20.0000 mg | ORAL_TABLET | Freq: Every day | ORAL | 2 refills | Status: DC

## 2017-05-26 MED ORDER — TEMAZEPAM 15 MG PO CAPS: 15.0000 mg | ORAL_CAPSULE | Freq: Every day | ORAL | 2 refills | Status: DC

## 2017-05-26 NOTE — Telephone Encounter (Signed)
noted 

## 2017-05-26 NOTE — Telephone Encounter (Signed)
I have verified the doubling up on there [employer]. Call both pharmacies and tell them no further refills on clonazepam. Call Pt and ask her to return current prescription

## 2017-05-26 NOTE — Telephone Encounter (Signed)
Just to clarify before calling both pharmacies, you wanted staff to call pharmacy's and inform them no further refills on Clonazepam instead of Xanax?

## 2017-05-26 NOTE — Telephone Encounter (Signed)
Tammy from [facility] called wanting to informed Dr. Harrington Challenger about pt Xanax. Per Tammy, pt has been jumping from their pharmacy [facility] and [facility] for a while now. Per Tammy, pt brought her Xanax in for refill but she informed pt that it was too early to get them filled. Per Tammy, she is aware that the quantity is written for 90 tabs with 2 refills. Per Tammy from [facility], on 04-25-2017, pt picked up Xanax with quantity on 60 from them and on 04-28-2017, pt went to [facility] and also picked up 60 tabs from the. Per Tammy, this is a total of 120. Per Tammy, they would like advise from Dr. Harrington Challenger as to what to do with the script pt brought over today for quantity of 90 tabs 2 refill? Per Tammy, should they hold script and have pt finish what she currently have or should they go ahead and fill this new script? [facility] number is [phone].

## 2017-05-26 NOTE — Telephone Encounter (Signed)
Called pt pharmacy and spoke with Jonni Sanger. Informed Jonni Sanger that provider wanted no further refill of pt Xanax. Per Jonni Sanger during previous call, he asked if it was okay for the pharmacy to just mail office the Xanax because they have already took the script from pt and have it on hold. Informed provider and she agreed with idea. Informed Jonni Sanger that provider agrees with [facility] just mailing the Xanax to Dr. Harrington Challenger office. Per Jonni Sanger he will void all the Xanax on file for pt and due to Xanax being on the same printed sheet as Temazepam, he will just mail the entire sheet back to office and will void all script on hold previous and current for pt Xanax.

## 2017-05-26 NOTE — Telephone Encounter (Signed)
Sorry, I meant xanax
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
## 2017-05-26 NOTE — Telephone Encounter (Signed)
[facility] and spoke with Trip and informed him with what Dr. Harrington Challenger stated about pt Xanax. Trip verbalized understand. Per Trip he was the one that discovered pt was refilling Xanax too soon and called [facility] to stop them from filling. Per trip, when he discovered pt early fill of her Xanax he had a discussion with pt and she verbalized understanding and they filled pt Trazodone and Paxil.

## 2017-05-28 ENCOUNTER — Telehealth (HOSPITAL_COMMUNITY): Payer: Self-pay | Admitting: *Deleted

## 2017-05-28 NOTE — Telephone Encounter (Signed)
Office received mail with pt Xanax and Temazepam script due to previous request from previous phone call. Original script was mailed back and staff put script in provider's box. Script ID number is [id number] and order ID numbers are [id number] and Temazepam 15 mg script order ID number is [id number]. Per pharmacy they need pt to bring in new script for her Temazepam if provider still wants pt to take this medication.

## 2017-05-29 NOTE — Telephone Encounter (Signed)
Please shred the entire page

## 2017-06-01 ENCOUNTER — Ambulatory Visit: Admitting: Family Medicine

## 2017-06-01 NOTE — Telephone Encounter (Signed)
Printed script was token from provider's box and shredded 06-01-2017

## 2017-06-02 ENCOUNTER — Telehealth (HOSPITAL_COMMUNITY): Payer: Self-pay | Admitting: *Deleted

## 2017-06-02 NOTE — Telephone Encounter (Signed)
I have already cut off her benzodiazepine. I don't prescribe opiates. We can do a drug test next time she comes in

## 2017-06-02 NOTE — Telephone Encounter (Signed)
Janice Kirk called stating she is pt mother and she is concerned about pt and her medications. Per [name], pt is no harm to self or other but she believes pt is miss using her medications. Mrs, Kenton Kingfisher name is under pt emergency contact list but pt do not have release of information form signed for office to freely speak with [name]. Per [name], she states pt is taking Hydrocodone with acetaminophen that is not prescribed to her. Per [name], she saw pt one day pt took her fathers pain medications and put a stop to that. Per pt mother, she now believes pt is taking her father in [place] and or a friends pain medication. Per pt mother, pt also have 2 different pharmacy and she was with her while getting one of her medication and the pharmacy told her she was too early because she had went to another pharmacy to get that medication filled. Per pt mother she tried to talk with pt to see if she could go over her medications with her to make sure she was taking her medications currently and pt told her it's going to take her a while and did not want to. Per pt mother, per pt mother lately pt has been slurring her words denying she's even taking any medications and she thinks pt is misusing her medications plus the ones that Dr. Harrington Challenger is giving her because she's been acting and looking like she's in a daze. Informed [name] that information will be token but office can not inform her if pt is seen at this office due to HIPAA and [name] verbalized understanding. Per [name] she is just concern for her daughter and she just want Dr. Harrington Challenger to be aware of this information. Per pt mother, she knows Dr. Harrington Challenger see pt and she just want provider to be aware. [name] number is [phone].

## 2017-06-02 NOTE — Telephone Encounter (Signed)
noted 

## 2017-06-09 ENCOUNTER — Ambulatory Visit (INDEPENDENT_AMBULATORY_CARE_PROVIDER_SITE_OTHER): Admitting: Family Medicine

## 2017-06-09 ENCOUNTER — Encounter: Payer: Self-pay | Admitting: Family Medicine

## 2017-06-09 VITALS — BP 136/74 | HR 84 | Temp 99.0°F | Resp 16 | Ht 63.0 in | Wt 118.0 lb

## 2017-06-09 DIAGNOSIS — L02416 Cutaneous abscess of left lower limb: Secondary | ICD-10-CM | POA: Diagnosis not present

## 2017-06-09 MED ORDER — SULFAMETHOXAZOLE-TRIMETHOPRIM 800-160 MG PO TABS: 1.0000 | ORAL_TABLET | Freq: Two times a day (BID) | ORAL | 0 refills | Status: DC

## 2017-06-09 NOTE — Patient Instructions (Signed)
See me in 48 hours Take the septra twice a day Take ibuprofen 800 mg for pain  See me in 2-3 days for follow up

## 2017-06-09 NOTE — Progress Notes (Signed)
    Chief Complaint  Patient presents with  . Wound Infection    left leg x 1 week  Patient is here for a wound on her left leg. She had multiple insect bites on her leg. The front of her left shin has gotten red, swollen, and is draining yellow pus. Very painful. She's been using warm compresses. She has not had any fever or chills.   Patient Active Problem List   Diagnosis Date Noted  . Tobacco abuse 02/05/2017  . Post traumatic stress disorder (PTSD) 01/12/2017  . GAD (generalized anxiety disorder) 12/15/2016  . Nephrolithiasis 04/26/2014  . Asthma 03/02/2013  . Abnormal Pap smear of cervix 03/02/2013    Outpatient Encounter Prescriptions as of 06/09/2017  Medication Sig  . ALPRAZolam (XANAX) 0.5 MG tablet Take 1 tablet (0.5 mg total) by mouth 3 (three) times daily as needed for anxiety.  Marland Kitchen PARoxetine (PAXIL) 20 MG tablet Take 1 tablet (20 mg total) by mouth daily.  . temazepam (RESTORIL) 15 MG capsule Take 1 capsule (15 mg total) by mouth at bedtime.  . traZODone (DESYREL) 100 MG tablet Take 1 tablet (100 mg total) by mouth at bedtime.  . sulfamethoxazole-trimethoprim (BACTRIM DS,SEPTRA DS) 800-160 MG tablet Take 1 tablet by mouth 2 (two) times daily.   No facility-administered encounter medications on file as of 06/09/2017.     Allergies  Allergen Reactions  . Trazodone And Nefazodone     Per pt Nightmares,panic attacks in sleep and can't get up/per patient, this only occurred with the higher dose, she has been able to take the 50mg     Review of Systems  Constitutional: Negative for chills, fatigue and fever.  Skin: Positive for wound.  All other systems reviewed and are negative.   BP 136/74 (BP Location: Left Leg, Patient Position: Sitting, Cuff Size: Normal)   Pulse 84   Temp 99 F (37.2 C) (Temporal)   Resp 16   Ht 5\' 3"  (1.6 m)   Wt 118 lb (53.5 kg)   LMP 05/14/2017   SpO2 100%   BMI 20.90 kg/m   Physical Exam  Constitutional: She appears well-developed  and well-nourished. No distress.  HENT:  Head: Normocephalic and atraumatic.  Mouth/Throat: Oropharynx is clear and moist.  Skin: Skin is warm and dry.  Multiple small scabs on ankles. On the anterior left shin there is a 1 cm open wound with purulence visible. It is surrounded by 5 cm of erythema. Mild fluctuance. Culture obtained. Small pustules surrounding. No inguinal adenopathy.  Psychiatric: She has a normal mood and affect. Her behavior is normal.    ASSESSMENT/PLAN:  1. Abscess of left lower leg  - Wound culture - Wound culture   Patient Instructions  See me in 48 hours Take the septra twice a day Take ibuprofen 800 mg for pain  See me in 2-3 days for follow up   Raylene Everts, MD

## 2017-06-10 ENCOUNTER — Telehealth: Payer: Self-pay | Admitting: *Deleted

## 2017-06-10 ENCOUNTER — Ambulatory Visit (INDEPENDENT_AMBULATORY_CARE_PROVIDER_SITE_OTHER): Admitting: Family Medicine

## 2017-06-10 ENCOUNTER — Telehealth (HOSPITAL_COMMUNITY): Payer: Self-pay | Admitting: *Deleted

## 2017-06-10 ENCOUNTER — Ambulatory Visit (HOSPITAL_COMMUNITY): Admitting: Psychiatry

## 2017-06-10 ENCOUNTER — Other Ambulatory Visit (HOSPITAL_COMMUNITY)
Admission: AD | Admit: 2017-06-10 | Discharge: 2017-06-10 | Disposition: A | Discharge status: Home / Self Care | Source: Skilled Nursing Facility | Attending: Family Medicine | Admitting: Family Medicine

## 2017-06-10 DIAGNOSIS — Z91199 Patient's noncompliance with other medical treatment and regimen due to unspecified reason: Secondary | ICD-10-CM

## 2017-06-10 DIAGNOSIS — Z5329 Procedure and treatment not carried out because of patient's decision for other reasons: Secondary | ICD-10-CM

## 2017-06-10 DIAGNOSIS — L02416 Cutaneous abscess of left lower limb: Secondary | ICD-10-CM | POA: Insufficient documentation

## 2017-06-10 NOTE — Telephone Encounter (Signed)
Pt seen by dr Meda Coffee today

## 2017-06-10 NOTE — Telephone Encounter (Signed)
Patient's mom called stating patient is in so much pain with her legs and now is having nausea and headaches. Patient would like to know if something can be called in or what should she do. Please advise [phone]

## 2017-06-10 NOTE — Telephone Encounter (Signed)
Called pt to resch due to provider being out of office. Unable to reach pt and staff lmtcb on pt voicemail.

## 2017-06-11 ENCOUNTER — Ambulatory Visit: Payer: Self-pay | Admitting: Family Medicine

## 2017-06-11 ENCOUNTER — Telehealth (HOSPITAL_COMMUNITY): Payer: Self-pay | Admitting: *Deleted

## 2017-06-11 ENCOUNTER — Telehealth: Payer: Self-pay | Admitting: *Deleted

## 2017-06-11 NOTE — Telephone Encounter (Signed)
Patient called for results °

## 2017-06-11 NOTE — Telephone Encounter (Signed)
Not finished

## 2017-06-11 NOTE — Telephone Encounter (Signed)
Pt called stating she is needing refills for all her medications provider prescribes her. Per pt office called her to resch her appt from yesterday 06-11-2017 to 06-17-2017. Per pt she was trying to get off of Trazodone completely due to it not working well. Pt number is [phone]. Informed pt all of her medications should have refills on them. Informed pt that her Xanax she will need to come into office and discuss that with Dr. Harrington Challenger and pt verbalized understanding and stated oh great.

## 2017-06-11 NOTE — Progress Notes (Signed)
No show

## 2017-06-11 NOTE — Telephone Encounter (Signed)
ok 

## 2017-06-13 LAB — AEROBIC CULTURE  (SUPERFICIAL SPECIMEN)

## 2017-06-13 LAB — AEROBIC CULTURE W GRAM STAIN (SUPERFICIAL SPECIMEN)

## 2017-06-15 ENCOUNTER — Telehealth: Payer: Self-pay | Admitting: Family Medicine

## 2017-06-15 NOTE — Telephone Encounter (Signed)
Patient calling wanting to know the results of testing done last week for the place/infection on the L leg.

## 2017-06-17 ENCOUNTER — Encounter (HOSPITAL_COMMUNITY): Payer: Self-pay | Admitting: Psychiatry

## 2017-06-17 ENCOUNTER — Ambulatory Visit (INDEPENDENT_AMBULATORY_CARE_PROVIDER_SITE_OTHER): Admitting: Psychiatry

## 2017-06-17 VITALS — BP 136/93 | HR 125 | Ht 63.0 in | Wt 113.8 lb

## 2017-06-17 DIAGNOSIS — Z62898 Other specified problems related to upbringing: Secondary | ICD-10-CM

## 2017-06-17 DIAGNOSIS — Z630 Problems in relationship with spouse or partner: Secondary | ICD-10-CM | POA: Diagnosis not present

## 2017-06-17 DIAGNOSIS — F4310 Post-traumatic stress disorder, unspecified: Secondary | ICD-10-CM

## 2017-06-17 DIAGNOSIS — G4700 Insomnia, unspecified: Secondary | ICD-10-CM

## 2017-06-17 DIAGNOSIS — F419 Anxiety disorder, unspecified: Secondary | ICD-10-CM | POA: Diagnosis not present

## 2017-06-17 DIAGNOSIS — R634 Abnormal weight loss: Secondary | ICD-10-CM

## 2017-06-17 DIAGNOSIS — F17210 Nicotine dependence, cigarettes, uncomplicated: Secondary | ICD-10-CM

## 2017-06-17 DIAGNOSIS — Z813 Family history of other psychoactive substance abuse and dependence: Secondary | ICD-10-CM

## 2017-06-17 DIAGNOSIS — Z811 Family history of alcohol abuse and dependence: Secondary | ICD-10-CM

## 2017-06-17 MED ORDER — BUSPIRONE HCL 5 MG PO TABS: 5.0000 mg | ORAL_TABLET | Freq: Three times a day (TID) | ORAL | 3 refills | Status: DC

## 2017-06-17 MED ORDER — PAROXETINE HCL 20 MG PO TABS: 20.0000 mg | ORAL_TABLET | Freq: Every day | ORAL | 2 refills | Status: DC
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
## 2017-06-19 ENCOUNTER — Encounter (HOSPITAL_COMMUNITY): Payer: Self-pay | Admitting: Emergency Medicine

## 2017-06-19 ENCOUNTER — Emergency Department (HOSPITAL_COMMUNITY)
Admission: EM | Admit: 2017-06-19 | Discharge: 2017-06-19 | Disposition: A | Discharge status: Home / Self Care | Attending: Emergency Medicine | Admitting: Emergency Medicine

## 2017-06-19 DIAGNOSIS — R110 Nausea: Secondary | ICD-10-CM | POA: Diagnosis present

## 2017-06-19 DIAGNOSIS — L02416 Cutaneous abscess of left lower limb: Secondary | ICD-10-CM | POA: Insufficient documentation

## 2017-06-19 DIAGNOSIS — J45909 Unspecified asthma, uncomplicated: Secondary | ICD-10-CM | POA: Insufficient documentation

## 2017-06-19 DIAGNOSIS — F17210 Nicotine dependence, cigarettes, uncomplicated: Secondary | ICD-10-CM | POA: Diagnosis not present

## 2017-06-19 DIAGNOSIS — Z79899 Other long term (current) drug therapy: Secondary | ICD-10-CM | POA: Insufficient documentation

## 2017-06-19 DIAGNOSIS — I10 Essential (primary) hypertension: Secondary | ICD-10-CM | POA: Insufficient documentation

## 2017-06-19 LAB — POC URINE PREG, ED: Preg Test, Ur: NEGATIVE

## 2017-06-19 MED ORDER — CLINDAMYCIN HCL 150 MG PO CAPS
450.0000 mg | ORAL_CAPSULE | Freq: Once | ORAL | Status: AC
  Administered 2017-06-19: 450 mg via ORAL
  Filled 2017-06-19: qty 3

## 2017-06-19 MED ORDER — CLINDAMYCIN HCL 150 MG PO CAPS: ORAL_CAPSULE | ORAL | 0 refills | Status: DC

## 2017-06-19 NOTE — Discharge Instructions (Signed)
Take the prescription as directed.  Apply moist heat to the area(s) on your left leg for 15 minutes at a time, several times per day for the next few days.  Do not fall asleep on a heating pack. Wash the area with soap and water at least twice a day, and cover with a clean/dry dressing.  Change the dressing whenever it becomes wet or soiled after washing the area with soap and water.  Call your regular medical doctor on Monday to schedule a follow up appointment for a recheck within the next 3 days.  Return to the Emergency Department immediately if worsening.

## 2017-06-19 NOTE — Telephone Encounter (Signed)
Patient has reviewed results on MyChart, is concerned that medication is not the correct medication and more spots are appearing. I apologized for the delay, explained Dr. Meda Coffee had been out of the office. I also advised that she go to the ED because they can see results and treat immediately and I would inform Dr. Meda Coffee of this and Dr. Meda Coffee would follow up as needed.

## 2017-06-19 NOTE — Telephone Encounter (Signed)
Patient's mom called wanting to know the results about patient's leg, per mom they are breaking out in other spots and they need to know what's going on. I made her aware Dr Meda Coffee is not in the office this week so we are unable to know the results from the test. Mom stated she will call and talk with the office manager regarding this because it seems to take a while to have anyone get back with her if they do at all. I made her aware on the 16th Dr Meda Coffee stated they the test was not finished yet. Mom states she will find a doctor who can help her.

## 2017-06-19 NOTE — ED Provider Notes (Signed)
[facility] DEPT Provider Note   CSN: [id number] Arrival date & time: 06/19/17  1538     History   Chief Complaint Chief Complaint  Patient presents with  . Wound Check    mersa dx and here for eval  . Nausea    From antibiotics?    HPI Janice Kirk is a 26 y.o. female.  HPI  Pt was seen at 1605. Per pt and her mother, c/o gradual onset and resolution of one episode of "abscess" on her left lower anterior tibial area that began 2 weeks ago. Pt was evaluated by her PMD on 06/09/17 for same, rx bactrim x5 days, and had wound culture completed. Pt does not know the results of the culture. States she completed the antibiotics as prescribed. Pt has concerns she "wasn't on the right antibiotic" and "was nauseated" while on the antibiotic. Pt states another small pustule area has now appeared on her left leg over the past few days. Denies fevers, no other areas of rash/pustules, no vomiting/diarrhea, no back pain, no abd pain, no CP/SOB.  Past Medical History:  Diagnosis Date  . Abnormal pap   . Asthma   . Hypertension    with pregnancy  . Pregnant state, incidental   . Renal disorder    kidney stones  . Tobacco abuse 02/05/2017    Patient Active Problem List   Diagnosis Date Noted  . Tobacco abuse 02/05/2017  . Post traumatic stress disorder (PTSD) 01/12/2017  . GAD (generalized anxiety disorder) 12/15/2016  . Nephrolithiasis 04/26/2014  . Asthma 03/02/2013  . Abnormal Pap smear of cervix 03/02/2013    No past surgical history on file.  OB History    Gravida Para Term Preterm AB Living   1 1 1     1    SAB TAB Ectopic Multiple Live Births           1       Home Medications    Prior to Admission medications   Medication Sig Start Date End Date Taking? Authorizing Provider  busPIRone (BUSPAR) 5 MG tablet Take 1 tablet (5 mg total) by mouth 3 (three) times daily. 06/17/17   Cloria Spring, MD  PARoxetine (PAXIL) 20 MG tablet Take 1 tablet (20 mg total) by mouth  daily. 06/17/17   Cloria Spring, MD  sulfamethoxazole-trimethoprim (BACTRIM DS,SEPTRA DS) 800-160 MG tablet Take 1 tablet by mouth 2 (two) times daily. 06/09/17   Raylene Everts, MD    Family History Family History  Problem Relation Age of Onset  . Adopted: Yes  . Drug abuse Mother   . Alcohol abuse Mother     Social History Social History  Substance Use Topics  . Smoking status: Current Every Day Smoker    Packs/day: 0.50    Types: Cigarettes    Start date: 06/14/2016  . Smokeless tobacco: Never Used  . Alcohol use No     Allergies   Trazodone and nefazodone   Review of Systems Review of Systems ROS: Statement: All systems negative except as marked or noted in the HPI; Constitutional: Negative for fever and chills. ; ; Eyes: Negative for eye pain, redness and discharge. ; ; ENMT: Negative for ear pain, hoarseness, nasal congestion, sinus pressure and sore throat. ; ; Cardiovascular: Negative for chest pain, palpitations, diaphoresis, dyspnea and peripheral edema. ; ; Respiratory: Negative for cough, wheezing and stridor. ; ; Gastrointestinal: +nausea. Negative for vomiting, diarrhea, abdominal pain, blood in stool, hematemesis, jaundice and rectal  bleeding. . ; ; Genitourinary: Negative for dysuria, flank pain and hematuria. ; ; Musculoskeletal: Negative for back pain and neck pain. Negative for swelling and trauma.; ; Skin: Negative for pruritus, rash, abrasions, bruising and +skin lesion.; ; Neuro: Negative for headache, lightheadedness and neck stiffness. Negative for weakness, altered level of consciousness, altered mental status, extremity weakness, paresthesias, involuntary movement, seizure and syncope.       Physical Exam Updated Vital Signs BP 138/84 (BP Location: Right Arm)   Pulse (!) 107   Temp 98.5 F (36.9 C) (Oral)   Resp 18   Ht 5\' 3"  (1.6 m)   Wt 46.3 kg (102 lb)   LMP 06/01/2017   SpO2 100%   BMI 18.07 kg/m   Physical Exam 1610: Physical  examination:  Nursing notes reviewed; Vital signs and O2 SAT reviewed;  Constitutional: Well developed, Well nourished, Well hydrated, In no acute distress; Head:  Normocephalic, atraumatic; Eyes: EOMI, PERRL, No scleral icterus; ENMT: Mouth and pharynx normal, Mucous membranes moist; Neck: Supple, Full range of motion, No lymphadenopathy; Cardiovascular: Regular rate and rhythm, No gallop; Respiratory: Breath sounds clear & equal bilaterally, No wheezes.  Speaking full sentences with ease, Normal respiratory effort/excursion; Chest: Nontender, Movement normal; Abdomen: Soft, Nontender, Nondistended, Normal bowel sounds; Genitourinary: No CVA tenderness; Extremities: Pulses normal, No tenderness, No deformity. No edema, No calf edema or asymmetry. +very small pustule left proximal anterior tibial area. +healing wound just below it with scab, no erythema, no drainage. +multiple small abrasions to bilat lower legs/ankles, no erythema, no streaking, no drainage, no ecchymosis.; Neuro: AA&Ox3, Major CN grossly intact.  Speech clear. No gross focal motor or sensory deficits in extremities.; Skin: Color normal, Warm, Dry.   ED Treatments / Results  Labs (all labs ordered are listed, but only abnormal results are displayed)   EKG  EKG Interpretation None       Radiology   Procedures Procedures (including critical care time)  Medications Ordered in ED Medications - No data to display   Initial Impression / Assessment and Plan / ED Course  I have reviewed the triage vital signs and the nursing notes.  Pertinent labs & imaging results that were available during my care of the patient were reviewed by me and considered in my medical decision making (see chart for details).  MDM Reviewed: previous chart, nursing note and vitals Reviewed previous: labs Interpretation: labs   Results for orders placed or performed during the hospital encounter of 06/10/17  Aerobic Culture (superficial  specimen)  Result Value Ref Range   Specimen Description LEG LEFT    Special Requests NONE    Gram Stain      RARE WBC PRESENT, PREDOMINANTLY PMN RARE GRAM POSITIVE COCCI IN CLUSTERS Performed at [facility], [address], [place],  [address]    Culture FEW METHICILLIN RESISTANT STAPHYLOCOCCUS AUREUS    Report Status 06/13/2017 FINAL    Organism ID, Bacteria METHICILLIN RESISTANT STAPHYLOCOCCUS AUREUS       Susceptibility   Methicillin resistant staphylococcus aureus - MIC*    CIPROFLOXACIN >=8 RESISTANT Resistant     ERYTHROMYCIN >=8 RESISTANT Resistant     GENTAMICIN <=0.5 SENSITIVE Sensitive     OXACILLIN >=4 RESISTANT Resistant     TETRACYCLINE <=1 SENSITIVE Sensitive     VANCOMYCIN <=0.5 SENSITIVE Sensitive     TRIMETH/SULFA <=10 SENSITIVE Sensitive     CLINDAMYCIN <=0.25 SENSITIVE Sensitive     RIFAMPIN <=0.5 SENSITIVE Sensitive     Inducible  Clindamycin NEGATIVE Sensitive     * FEW METHICILLIN RESISTANT STAPHYLOCOCCUS AUREUS    Results for orders placed or performed during the hospital encounter of 06/19/17  POC urine preg, ED  Result Value Ref Range   Preg Test, Ur NEGATIVE NEGATIVE    1640:  Preg test negative. Has tol PO well while in the ED without N/V and abd benign. Will tx clindamycin, warm compresses, f/u PMD. Dx and testing d/w pt and family.  Questions answered.  Verb understanding, agreeable to d/c home with outpt f/u.    Final Clinical Impressions(s) / ED Diagnoses   Final diagnoses:  None    New Prescriptions New Prescriptions   No medications on file     Francine Graven, DO 06/21/17 2243

## 2017-06-19 NOTE — ED Triage Notes (Signed)
Had wound to back LR leg and wound cultured- pos for MERSA- MD put her on antibiotics Pt wants to make sure she is on the right antibiotic as well as reports N since beginning antibiotics  Dr Meda Coffee is PCP and is out of town per pt and mother

## 2017-06-22 NOTE — Telephone Encounter (Signed)
She was seen in the ER on the 24th.  They told her it was MRSA.  She took septra - which it is sensitive to, and is now on Clindamycin.  Both of these drugs treat MRSA.  She needs to get an antibacterial scrub and clean skin well. Wash hands frequently and avoid touching areas. Should take 10 days of antibiotic.  Will refer to Derm if it continues.

## 2017-06-22 NOTE — Telephone Encounter (Signed)
Called patient regarding message below. No answer, left generic message for patient to return call.   

## 2017-06-23 NOTE — Telephone Encounter (Signed)
Called patient regarding message below. No answer, left generic message for patient to return call.   

## 2017-07-15 ENCOUNTER — Ambulatory Visit (HOSPITAL_COMMUNITY): Admitting: Psychiatry

## 2017-07-23 ENCOUNTER — Encounter: Payer: Self-pay | Admitting: Family Medicine

## 2017-10-09 ENCOUNTER — Other Ambulatory Visit: Payer: Self-pay

## 2017-10-09 ENCOUNTER — Ambulatory Visit (INDEPENDENT_AMBULATORY_CARE_PROVIDER_SITE_OTHER): Admitting: Family Medicine

## 2017-10-09 ENCOUNTER — Encounter: Payer: Self-pay | Admitting: Family Medicine

## 2017-10-09 VITALS — BP 126/78 | HR 108 | Temp 98.3°F | Resp 16 | Ht 63.0 in | Wt 115.1 lb

## 2017-10-09 DIAGNOSIS — R300 Dysuria: Secondary | ICD-10-CM | POA: Diagnosis not present

## 2017-10-09 DIAGNOSIS — N3090 Cystitis, unspecified without hematuria: Secondary | ICD-10-CM

## 2017-10-09 LAB — POCT URINALYSIS DIPSTICK
Bilirubin, UA: NEGATIVE
GLUCOSE UA: NEGATIVE
KETONES UA: NEGATIVE
Nitrite, UA: NEGATIVE
Protein, UA: NEGATIVE
Spec Grav, UA: 1.015 (ref 1.010–1.025)
Urobilinogen, UA: 0.2 E.U./dL
pH, UA: 6 (ref 5.0–8.0)

## 2017-10-09 MED ORDER — SULFAMETHOXAZOLE-TRIMETHOPRIM 800-160 MG PO TABS: 1.0000 | ORAL_TABLET | Freq: Two times a day (BID) | ORAL | 1 refills | Status: DC

## 2017-10-09 NOTE — Progress Notes (Signed)
Chief Complaint  Patient presents with  . Dysuria  Patient states she has had recurring urinary tract infections.  She knows the symptoms of a bladder infection.  She is having some suprapubic pressure, dysuria, and frequency.  There is a strong smell to her urine.  No hematuria.  No flank pain.  No nausea or vomiting.  No fever or chills.  She has a history of kidney stones, no suspicion of kidney involvement at this time.  She is having regular menstrual periods, no vaginal discharge or discomfort.   Patient Active Problem List   Diagnosis Date Noted  . Tobacco abuse 02/05/2017  . Post traumatic stress disorder (PTSD) 01/12/2017  . GAD (generalized anxiety disorder) 12/15/2016  . Nephrolithiasis 04/26/2014  . Asthma 03/02/2013  . Abnormal Pap smear of cervix 03/02/2013    Outpatient Encounter Medications as of 10/09/2017  Medication Sig  . sulfamethoxazole-trimethoprim (BACTRIM DS,SEPTRA DS) 800-160 MG tablet Take 1 tablet by mouth 2 (two) times daily.   No facility-administered encounter medications on file as of 10/09/2017.     Allergies  Allergen Reactions  . Trazodone And Nefazodone     Per pt Nightmares,panic attacks in sleep and can't get up/per patient, this only occurred with the higher dose, she has been able to take the 50mg     Review of Systems  Constitutional: Negative for chills and fever.  HENT: Negative for congestion and sore throat.   Eyes: Negative for redness and visual disturbance.  Respiratory: Negative for cough and shortness of breath.   Cardiovascular: Negative for chest pain and palpitations.  Gastrointestinal: Negative for abdominal pain, diarrhea, nausea and vomiting.  Genitourinary: Positive for dysuria and frequency. Negative for difficulty urinating, flank pain and hematuria.  Musculoskeletal: Negative for back pain.  Psychiatric/Behavioral: Positive for dysphoric mood and sleep disturbance.       Is going through divorce, stressed  All  other systems reviewed and are negative.    BP 126/78 (BP Location: Left Arm, Patient Position: Sitting, Cuff Size: Normal)   Pulse (!) 108   Temp 98.3 F (36.8 C) (Temporal)   Resp 16   Ht 5\' 3"  (1.6 m)   Wt 115 lb 1.9 oz (52.2 kg)   SpO2 100%   BMI 20.39 kg/m   Physical Exam  Constitutional: She is oriented to person, place, and time. She appears well-developed and well-nourished. No distress.  Bland affect  HENT:  Head: Normocephalic and atraumatic.  Mouth/Throat: Oropharynx is clear and moist.  Neck: Normal range of motion.  Cardiovascular: Normal rate, regular rhythm and normal heart sounds.  Pulmonary/Chest: Effort normal and breath sounds normal.  Abdominal: Soft. Bowel sounds are normal. There is no tenderness.  No flank pain/CVAT  Lymphadenopathy:    She has no cervical adenopathy.  Neurological: She is alert and oriented to person, place, and time.  Psychiatric: Thought content normal. Her affect is blunt. She is withdrawn. Cognition and memory are normal.   Results for orders placed or performed in visit on 10/09/17  POCT urinalysis dipstick  Result Value Ref Range   Color, UA yellow    Clarity, UA sl cloudy    Glucose, UA neg    Bilirubin, UA neg    Ketones, UA neg    Spec Grav, UA 1.015 1.010 - 1.025   Blood, UA trace    pH, UA 6.0 5.0 - 8.0   Protein, UA neg    Urobilinogen, UA 0.2 0.2 or 1.0 E.U./dL   Nitrite, UA  neg    Leukocytes, UA Trace (A) Negative   Appearance cloudy    Odor n/a     ASSESSMENT/PLAN:  1. Dysuria  - POCT urinalysis dipstick - Urine Culture  2. Cystitis  - Urine Culture   Patient Instructions  Need to take the septra twice a day Push fluids Call if not improving in a couple of days   Raylene Everts, MD

## 2017-10-09 NOTE — Patient Instructions (Signed)
Need to take the septra twice a day Push fluids Call if not improving in a couple of days

## 2017-10-10 LAB — URINE CULTURE
MICRO NUMBER:: 81408330
Result:: NO GROWTH
SPECIMEN QUALITY:: ADEQUATE

## 2017-10-19 ENCOUNTER — Encounter: Payer: Self-pay | Admitting: Family Medicine

## 2017-10-19 ENCOUNTER — Emergency Department (HOSPITAL_COMMUNITY)
Admission: EM | Admit: 2017-10-19 | Discharge: 2017-10-19 | Disposition: A | Discharge status: Home / Self Care | Attending: Emergency Medicine | Admitting: Emergency Medicine

## 2017-10-19 ENCOUNTER — Other Ambulatory Visit: Payer: Self-pay

## 2017-10-19 ENCOUNTER — Encounter (HOSPITAL_COMMUNITY): Payer: Self-pay | Admitting: Emergency Medicine

## 2017-10-19 ENCOUNTER — Emergency Department (HOSPITAL_COMMUNITY)

## 2017-10-19 ENCOUNTER — Ambulatory Visit (INDEPENDENT_AMBULATORY_CARE_PROVIDER_SITE_OTHER): Admitting: Family Medicine

## 2017-10-19 VITALS — BP 116/58 | HR 108 | Temp 98.5°F | Resp 16 | Ht 63.0 in | Wt 113.0 lb

## 2017-10-19 DIAGNOSIS — F17210 Nicotine dependence, cigarettes, uncomplicated: Secondary | ICD-10-CM | POA: Insufficient documentation

## 2017-10-19 DIAGNOSIS — Z79899 Other long term (current) drug therapy: Secondary | ICD-10-CM | POA: Diagnosis not present

## 2017-10-19 DIAGNOSIS — R319 Hematuria, unspecified: Secondary | ICD-10-CM | POA: Diagnosis not present

## 2017-10-19 DIAGNOSIS — R109 Unspecified abdominal pain: Secondary | ICD-10-CM

## 2017-10-19 DIAGNOSIS — J45909 Unspecified asthma, uncomplicated: Secondary | ICD-10-CM | POA: Insufficient documentation

## 2017-10-19 HISTORY — DX: Post-traumatic stress disorder, unspecified: F43.10

## 2017-10-19 HISTORY — DX: Generalized anxiety disorder: F41.1

## 2017-10-19 LAB — URINALYSIS, ROUTINE W REFLEX MICROSCOPIC
Bacteria, UA: NONE SEEN
Bilirubin Urine: NEGATIVE
GLUCOSE, UA: NEGATIVE mg/dL
KETONES UR: 5 mg/dL — AB
Leukocytes, UA: NEGATIVE
NITRITE: NEGATIVE
PH: 5 (ref 5.0–8.0)
Protein, ur: NEGATIVE mg/dL
Specific Gravity, Urine: 1.026 (ref 1.005–1.030)

## 2017-10-19 LAB — POCT URINALYSIS DIPSTICK
APPEARANCE: NORMAL
GLUCOSE UA: NEGATIVE
LEUKOCYTES UA: NEGATIVE
NITRITE UA: NEGATIVE
PROTEIN UA: 30
Urobilinogen, UA: 1 E.U./dL
pH, UA: 5.5 (ref 5.0–8.0)

## 2017-10-19 LAB — PREGNANCY, URINE: Preg Test, Ur: NEGATIVE

## 2017-10-19 MED ORDER — TRAMADOL HCL 50 MG PO TABS: 50.0000 mg | ORAL_TABLET | Freq: Four times a day (QID) | ORAL | 0 refills | Status: DC | PRN

## 2017-10-19 MED ORDER — METAXALONE 800 MG PO TABS: 800.0000 mg | ORAL_TABLET | Freq: Three times a day (TID) | ORAL | 0 refills | Status: DC

## 2017-10-19 MED ORDER — TRAMADOL HCL 50 MG PO TABS
50.0000 mg | ORAL_TABLET | Freq: Once | ORAL | Status: AC
  Administered 2017-10-19: 50 mg via ORAL
  Filled 2017-10-19: qty 1

## 2017-10-19 NOTE — Discharge Instructions (Signed)
As discussed, your CT scan is negative for kidney stones or any other source of your pain.  Your pain suggests a musculoskeletal source (muscles, spine) since it is worse with movement and palpation. Try the medicines prescribed and continue using your heating pad as you are doing.  Tramadol may make you drowsy, skelaxin (muscle relaxer should not).  You will need further testing if you continue to see blood in your urine.  I am giving you the name of a urologist for this but you may also discuss with Dr Meda Coffee

## 2017-10-19 NOTE — ED Triage Notes (Signed)
Pt reports diagnosed with UTI several weeks ago. Pt reports was prescribed antibiotics. Pt reports continued hematuria, bilateral flank pain, urinary frequency, and fever. Pt reports was sent over by PCP for rule out kidney stone.

## 2017-10-19 NOTE — Progress Notes (Signed)
Chief Complaint  Patient presents with  . Urinary Tract Infection    ?   Patient is here today for a possible urinary tract infection.  She was seen for these same symptoms on 10/09/17.  A urine culture was done at that time and it was negative.  She was given 3 days of Septra pending culture results.  This did not make her feel any improvement.  She does not have dysuria or frequency, no hematuria.  She does have flank pain radiating to her lower abdomen that is at times severe.  She has nausea but no vomiting.  She does not have any fever or chills.  She does have a history of kidney stones.  She is certain that she is not pregnant.  No vaginal discharge or bleeding. After discussion it sounds like she may be having a recurring kidney stone.  We did a dip urinalysis and it was positive for a large amount of blood.  I told her that I am unable to do the additional testing necessary for kidney stones on Christmas eve when her office closes is an hour.  I recommend she goes to the emergency room for follow-up testing   Patient Active Problem List   Diagnosis Date Noted  . Tobacco abuse 02/05/2017  . Post traumatic stress disorder (PTSD) 01/12/2017  . GAD (generalized anxiety disorder) 12/15/2016  . Nephrolithiasis 04/26/2014  . Asthma 03/02/2013  . Abnormal Pap smear of cervix 03/02/2013    Outpatient Encounter Medications as of 10/19/2017  Medication Sig  . sulfamethoxazole-trimethoprim (BACTRIM DS,SEPTRA DS) 800-160 MG tablet Take 1 tablet by mouth 2 (two) times daily. (Patient not taking: Reported on 10/19/2017)   No facility-administered encounter medications on file as of 10/19/2017.     Allergies  Allergen Reactions  . Trazodone And Nefazodone     Per pt Nightmares,panic attacks in sleep and can't get up/per patient, this only occurred with the higher dose, she has been able to take the 50mg     Review of Systems Flank pain, nausea, abdominal pain Denies fever chills  dysuria, vaginal symptoms See HPI   BP (!) 116/58 (BP Location: Left Arm, Patient Position: Sitting, Cuff Size: Normal)   Pulse (!) 108   Temp 98.5 F (36.9 C) (Temporal)   Resp 16   Ht 5\' 3"  (1.6 m)   Wt 113 lb 0.6 oz (51.3 kg)   LMP 10/07/2017   SpO2 100%   BMI 20.02 kg/m   Physical Exam  Constitutional: She is oriented to person, place, and time. She appears well-developed and well-nourished.  Emotional lability.  Uncomfortable  HENT:  Head: Normocephalic and atraumatic.  Mouth/Throat: Oropharynx is clear and moist.  Cardiovascular: Normal rate, regular rhythm and normal heart sounds.  Pulmonary/Chest: Effort normal and breath sounds normal.  Abdominal: Soft. Bowel sounds are normal. There is tenderness.  Tenderness in both flanks, also lumbar region to palpation.  Mild CVAT.  Exaggerated response to palpation.  Diffuse tenderness of abdomen with no guarding or rebound.  Neurological: She is alert and oriented to person, place, and time.  Psychiatric:  Tearful, labile.  At other times appears blunted      ASSESSMENT/PLAN:  1. Flank pain History of nephrolithiasis. - POCT urinalysis dipstick Results for orders placed or performed in visit on 10/19/17  POCT urinalysis dipstick  Result Value Ref Range   Color, UA yellow    Clarity, UA clear    Glucose, UA neg    Bilirubin, UA small  Ketones, UA trace    Spec Grav, UA >=1.030 (A) 1.010 - 1.025   Blood, UA large    pH, UA 5.5 5.0 - 8.0   Protein, UA 30    Urobilinogen, UA 1.0 0.2 or 1.0 E.U./dL   Nitrite, UA neg    Leukocytes, UA Negative Negative   Appearance normal    Odor none      Patient Instructions  If you feel that this might be a kidney stone, we need to send you to the ER for testing You do have pain and blood in your urine     Raylene Everts, MD

## 2017-10-19 NOTE — Patient Instructions (Signed)
If you feel that this might be a kidney stone, we need to send you to the ER for testing You do have pain and blood in your urine

## 2017-10-19 NOTE — ED Provider Notes (Signed)
[facility] EMERGENCY DEPARTMENT Provider Note   CSN: [id number] Arrival date & time: 10/19/17  1240     History   Chief Complaint Chief Complaint  Patient presents with  . Flank Pain    HPI Janice Kirk is a 26 y.o. female with a history of asthma, general anxiety disorder, PTSD and kidney stones, who was treated for a UTI by her primary provider on December 14 with Bactrim however she continues to have persistent symptoms which include continued hematuria along with urinary frequency and bilateral flank pain.  She denies urgency, fevers or chills, nausea or vomiting.  She was seen by her PCP today given her lack of response to treatment and was sent here for CT imaging to rule out kidney stones.  She reports constant pain however it is worsened with movement and palpation of her bilateral mid to lower back.  She denies midline thoracic or lumbar pain and denies history of significant back problems, although was told several years ago that she had "bone spurs" in her lumbar spine.  She has taken Aleve and has also used a heating pad on her back which she states has been somewhat helpful.  The history is provided by the patient.    Past Medical History:  Diagnosis Date  . Abnormal pap   . Asthma   . GAD (generalized anxiety disorder)   . Hypertension    with pregnancy  . Post traumatic stress disorder (PTSD)   . Pregnant state, incidental   . Renal disorder    kidney stones  . Tobacco abuse 02/05/2017    Patient Active Problem List   Diagnosis Date Noted  . Tobacco abuse 02/05/2017  . Post traumatic stress disorder (PTSD) 01/12/2017  . GAD (generalized anxiety disorder) 12/15/2016  . Nephrolithiasis 04/26/2014  . Asthma 03/02/2013  . Abnormal Pap smear of cervix 03/02/2013    History reviewed. No pertinent surgical history.  OB History    Gravida Para Term Preterm AB Living   1 1 1     1    SAB TAB Ectopic Multiple Live Births           1       Home  Medications    Prior to Admission medications   Medication Sig Start Date End Date Taking? Authorizing Provider  metaxalone (SKELAXIN) 800 MG tablet Take 1 tablet (800 mg total) by mouth 3 (three) times daily. 10/19/17   Evalee Jefferson, PA-C  sulfamethoxazole-trimethoprim (BACTRIM DS,SEPTRA DS) 800-160 MG tablet Take 1 tablet by mouth 2 (two) times daily. Patient not taking: Reported on 10/19/2017 10/09/17   Raylene Everts, MD  traMADol (ULTRAM) 50 MG tablet Take 1 tablet (50 mg total) by mouth every 6 (six) hours as needed. 10/19/17   Evalee Jefferson, PA-C    Family History Family History  Adopted: Yes  Problem Relation Age of Onset  . Drug abuse Mother   . Alcohol abuse Mother     Social History Social History   Tobacco Use  . Smoking status: Current Every Day Smoker    Packs/day: 0.50    Types: Cigarettes    Start date: 06/14/2016  . Smokeless tobacco: Never Used  Substance Use Topics  . Alcohol use: No  . Drug use: No     Allergies   Trazodone and nefazodone   Review of Systems Review of Systems  Constitutional: Negative for fever.  Respiratory: Negative for shortness of breath.   Cardiovascular: Negative for chest pain and leg swelling.  Gastrointestinal: Negative for abdominal distention, abdominal pain and constipation.  Genitourinary: Positive for dysuria, flank pain, frequency and hematuria. Negative for difficulty urinating and urgency.  Musculoskeletal: Positive for back pain. Negative for gait problem and joint swelling.  Skin: Negative for rash.  Neurological: Negative for weakness and numbness.     Physical Exam Updated Vital Signs BP 125/86 (BP Location: Right Arm)   Pulse 93   Temp 97.9 F (36.6 C) (Oral)   Resp 16   Ht 5\' 3"  (1.6 m)   Wt 51.3 kg (113 lb)   LMP 10/07/2017   SpO2 100%   BMI 20.02 kg/m   Physical Exam  Constitutional: She appears well-developed and well-nourished.  HENT:  Head: Normocephalic.  Eyes: Conjunctivae are  normal.  Neck: Normal range of motion. Neck supple.  Cardiovascular: Normal rate and intact distal pulses.  Pedal pulses normal.  Pulmonary/Chest: Effort normal.  Abdominal: Soft. Bowel sounds are normal. She exhibits no distension. There is no tenderness. There is no guarding.  Musculoskeletal: Normal range of motion. She exhibits no edema.       Lumbar back: She exhibits tenderness. She exhibits no bony tenderness, no swelling, no edema and no spasm.       Back:  Tender to palpation to even light touch bilateral flanks through paralumbar musculature.  No spasm appreciated.    Neurological: She is alert. She has normal strength. She displays no atrophy and no tremor. No sensory deficit. Gait normal.  Reflex Scores:      Patellar reflexes are 2+ on the right side and 2+ on the left side.      Achilles reflexes are 2+ on the right side and 2+ on the left side. No strength deficit noted in hip and knee flexor and extensor muscle groups.  Ankle flexion and extension intact.  Skin: Skin is warm and dry.  Psychiatric: She has a normal mood and affect.  Nursing note and vitals reviewed.    ED Treatments / Results  Labs (all labs ordered are listed, but only abnormal results are displayed) Labs Reviewed  URINALYSIS, ROUTINE W REFLEX MICROSCOPIC - Abnormal; Notable for the following components:      Result Value   APPearance HAZY (*)    Hgb urine dipstick SMALL (*)    Ketones, ur 5 (*)    Squamous Epithelial / LPF 0-5 (*)    All other components within normal limits  PREGNANCY, URINE    EKG  EKG Interpretation None       Radiology Ct Renal Stone Study  Result Date: 10/19/2017 CLINICAL DATA:  Acute bilateral flank pain, gross hematuria. EXAM: CT ABDOMEN AND PELVIS WITHOUT CONTRAST TECHNIQUE: Multidetector CT imaging of the abdomen and pelvis was performed following the standard protocol without IV contrast. COMPARISON:  CT scan of May 10, 2014. FINDINGS: Lower chest: No acute  abnormality. Hepatobiliary: No focal liver abnormality is seen. No gallstones, gallbladder wall thickening, or biliary dilatation. Pancreas: Unremarkable. No pancreatic ductal dilatation or surrounding inflammatory changes. Spleen: Normal in size without focal abnormality. Adrenals/Urinary Tract: Adrenal glands are unremarkable. Kidneys are normal, without renal calculi, focal lesion, or hydronephrosis. Bladder is unremarkable. Stomach/Bowel: Stomach is within normal limits. Appendix appears normal. No evidence of bowel wall thickening, distention, or inflammatory changes. Vascular/Lymphatic: No significant vascular findings are present. No enlarged abdominal or pelvic lymph nodes. Reproductive: Uterus and left adnexal regions are unremarkable. Stable dermoid is seen in the right ovary. Other: No abdominal wall hernia or abnormality. No abdominopelvic ascites.  Musculoskeletal: No acute or significant osseous findings. IMPRESSION: No hydronephrosis or renal obstruction is noted. No renal or ureteral calculi are noted. Stable right ovarian dermoid tumor. Electronically Signed   By: Marijo Conception, M.D.   On: 10/19/2017 15:19    Procedures Procedures (including critical care time)  Medications Ordered in ED Medications  traMADol (ULTRAM) tablet 50 mg (50 mg Oral Given 10/19/17 1819)     Initial Impression / Assessment and Plan / ED Course  I have reviewed the triage vital signs and the nursing notes.  Pertinent labs & imaging results that were available during my care of the patient were reviewed by me and considered in my medical decision making (see chart for details).     CT imaging and UA reviewed and discussed with patient.  She has hematuria of unclear etiology.  She denies current menses or menstrual spotting.  Her last menstrual period was early December and normal.  Patient was referred to urology for further evaluation of hematuria, also discussed following up with her primary doctor as  well.  Tenderness to palpation of her bilateral mid to lower back I favor this being of a musculoskeletal source, she was prescribed Skelaxin and tramadol, advised to continue using her Aleve and heat.  Return precautions discussed if symptoms worsen prior to her ability to follow-up with her providers.  Final Clinical Impressions(s) / ED Diagnoses   Final diagnoses:  Flank pain  Hematuria, unspecified type    ED Discharge Orders        Ordered    traMADol (ULTRAM) 50 MG tablet  Every 6 hours PRN     10/19/17 1803    metaxalone (SKELAXIN) 800 MG tablet  3 times daily     10/19/17 1803       Evalee Jefferson, Hershal Coria 10/19/17 1941    Fredia Sorrow, MD 10/19/17 2255

## 2017-11-16 ENCOUNTER — Ambulatory Visit (INDEPENDENT_AMBULATORY_CARE_PROVIDER_SITE_OTHER): Admitting: Family Medicine

## 2017-11-16 ENCOUNTER — Encounter: Payer: Self-pay | Admitting: Family Medicine

## 2017-11-16 ENCOUNTER — Other Ambulatory Visit: Payer: Self-pay

## 2017-11-16 VITALS — BP 128/76 | HR 111 | Temp 99.1°F | Resp 16 | Ht 63.0 in | Wt 118.8 lb

## 2017-11-16 DIAGNOSIS — M5442 Lumbago with sciatica, left side: Secondary | ICD-10-CM

## 2017-11-16 DIAGNOSIS — M47817 Spondylosis without myelopathy or radiculopathy, lumbosacral region: Secondary | ICD-10-CM

## 2017-11-16 MED ORDER — CYCLOBENZAPRINE HCL 5 MG PO TABS
5.0000 mg | ORAL_TABLET | Freq: Three times a day (TID) | ORAL | 1 refills | Status: AC | PRN
Start: 2017-11-16 — End: ?

## 2017-11-16 MED ORDER — MELOXICAM 15 MG PO TABS: 15.0000 mg | ORAL_TABLET | Freq: Every day | ORAL | 0 refills | Status: DC

## 2017-11-16 MED ORDER — METHYLPREDNISOLONE 4 MG PO TBPK: ORAL_TABLET | ORAL | 0 refills | Status: DC

## 2017-11-16 NOTE — Patient Instructions (Signed)
Take the prednisone as directed After finishing, take mobic once a day with food Take the cyclobenzaprine as needed muscle relaxer Start PT Use ice to left low back  Come back in 2-3 weeks

## 2017-11-16 NOTE — Progress Notes (Signed)
Chief Complaint  Patient presents with  . Back Pain   Patient is here for follow-up.  When I saw her on 10/19/17 she was had flank pain and hematuria.  She felt she was having a kidney stone.  I sent her to the emergency room.  Her CT scan was negative.  They told her that it was just back pain. She still has back pain.  Left low back.  Some radiation into posterior thigh.  She states she has had this randomly in the past.  She has had a x-rays from Dr. Wolfgang Phoenix in 2015.  Has not had physical therapy.  He referred her to a pain clinic.  She never went.  She states this bothered her intermittently.  She has no fever chills.  No nausea or vomiting.  No urinary complaints or frequency.  No hematuria. MRI from 2015 is reviewed.  Other than some mild facet hypertrophy there was not appreciable abnormality.  Patient Active Problem List   Diagnosis Date Noted  . Tobacco abuse 02/05/2017  . Post traumatic stress disorder (PTSD) 01/12/2017  . GAD (generalized anxiety disorder) 12/15/2016  . Nephrolithiasis 04/26/2014  . Asthma 03/02/2013  . Abnormal Pap smear of cervix 03/02/2013    Outpatient Encounter Medications as of 11/16/2017  Medication Sig  . metaxalone (SKELAXIN) 800 MG tablet Take 1 tablet (800 mg total) by mouth 3 (three) times daily.  . [DISCONTINUED] traMADol (ULTRAM) 50 MG tablet Take 1 tablet (50 mg total) by mouth every 6 (six) hours as needed.  . cyclobenzaprine (FLEXERIL) 5 MG tablet Take 1 tablet (5 mg total) by mouth 3 (three) times daily as needed for muscle spasms.  . meloxicam (MOBIC) 15 MG tablet Take 1 tablet (15 mg total) by mouth daily.  . methylPREDNISolone (MEDROL DOSEPAK) 4 MG TBPK tablet tad  . sulfamethoxazole-trimethoprim (BACTRIM DS,SEPTRA DS) 800-160 MG tablet Take 1 tablet by mouth 2 (two) times daily. (Patient not taking: Reported on 10/19/2017)   No facility-administered encounter medications on file as of 11/16/2017.     Allergies  Allergen Reactions  .  Trazodone And Nefazodone     Per pt Nightmares,panic attacks in sleep and can't get up/per patient, this only occurred with the higher dose, she has been able to take the 50mg     Review of Systems  Constitutional: Negative for activity change, appetite change and unexpected weight change.  HENT: Negative for congestion, dental problem, postnasal drip and rhinorrhea.   Eyes: Negative for redness and visual disturbance.  Respiratory: Negative for cough and shortness of breath.   Cardiovascular: Negative for chest pain, palpitations and leg swelling.  Gastrointestinal: Negative for abdominal pain, constipation and diarrhea.  Genitourinary: Positive for flank pain. Negative for difficulty urinating, frequency and menstrual problem.  Musculoskeletal: Positive for back pain. Negative for arthralgias.  Neurological: Negative for dizziness and headaches.  Psychiatric/Behavioral: Positive for behavioral problems, dysphoric mood and sleep disturbance. Negative for self-injury and suicidal ideas. The patient is nervous/anxious.   All other systems reviewed and are negative.   BP 128/76 (BP Location: Left Arm, Patient Position: Sitting, Cuff Size: Normal)   Pulse (!) 111   Temp 99.1 F (37.3 C) (Temporal)   Resp 16   Ht 5\' 3"  (1.6 m)   Wt 118 lb 12 oz (53.9 kg)   LMP 11/11/2017 (Exact Date)   SpO2 98%   BMI 21.04 kg/m   Physical Exam  Constitutional: She appears well-developed and well-nourished. She appears distressed.  Emotional distress.  Depressed face and attitude.  Smells of tobacco.  HENT:  Head: Normocephalic and atraumatic.  Mouth/Throat: Oropharynx is clear and moist.  Eyes: Conjunctivae are normal. Pupils are equal, round, and reactive to light.  Neck: Normal range of motion. Neck supple.  Cardiovascular: Normal rate, regular rhythm and normal heart sounds.  Pulmonary/Chest: Effort normal and breath sounds normal.  Musculoskeletal:  Low back is tender at the L5-S1 junction  centrally and towards the left, and over the left SI region.  No palpable muscle spasm.  Back is straight and symmetric.  Reflexes symmetric in both lower extremities.  Strength is intact.  Sensation is intact.  Straight leg raise on the left is positive at 45 degrees.  Neurological: She displays normal reflexes. Coordination normal.  Skin: Skin is warm and dry. No rash noted.    ASSESSMENT/PLAN:  1. Facet arthropathy, lumbosacral Seen on prior MRI - Ambulatory referral to Physical Therapy  2. Acute left-sided low back pain with left-sided sciatica By physical exam - Ambulatory referral to Physical Therapy  Spent time discussing the conservative management of low back pain.  Discussed the need to avoid narcotics.  She will be given an anti-inflammatory muscle relaxers.  Refer to physical therapy.  Counseled on activity limits.  She will referred to spine specialty if she fails to improve.  I do not think additional imaging is indicated at this time based on her lack of trauma, and stable physical exam findings. Patient Instructions  Take the prednisone as directed After finishing, take mobic once a day with food Take the cyclobenzaprine as needed muscle relaxer Start PT Use ice to left low back  Come back in 2-3 weeks   Raylene Everts, MD

## 2017-12-07 ENCOUNTER — Encounter: Payer: Self-pay | Admitting: Family Medicine

## 2017-12-07 ENCOUNTER — Ambulatory Visit (INDEPENDENT_AMBULATORY_CARE_PROVIDER_SITE_OTHER): Admitting: Family Medicine

## 2017-12-07 ENCOUNTER — Other Ambulatory Visit: Payer: Self-pay

## 2017-12-07 VITALS — BP 118/68 | HR 108 | Temp 98.2°F | Resp 16 | Ht 63.0 in | Wt 118.0 lb

## 2017-12-07 DIAGNOSIS — Z765 Malingerer [conscious simulation]: Secondary | ICD-10-CM | POA: Diagnosis not present

## 2017-12-07 DIAGNOSIS — M47816 Spondylosis without myelopathy or radiculopathy, lumbar region: Secondary | ICD-10-CM | POA: Diagnosis not present

## 2017-12-07 MED ORDER — GABAPENTIN 300 MG PO CAPS: 300.0000 mg | ORAL_CAPSULE | Freq: Three times a day (TID) | ORAL | 3 refills | Status: AC

## 2017-12-07 MED ORDER — IBUPROFEN 800 MG PO TABS: 800.0000 mg | ORAL_TABLET | Freq: Three times a day (TID) | ORAL | 3 refills | Status: AC | PRN

## 2017-12-07 NOTE — Patient Instructions (Addendum)
Try to use good body mechanics with your bending and lifting Stop meloxicam (Mobic) Continue Flexeril (cyclobenzaprine) as needed at night Take gabapentin for the nerve pain in your leg.  Start with 1 pill a day at bedtime, then increase to 2 or 3 pills a day as tolerated Go back to ibuprofen as needed pain.  Take with food Referred to spine specialist, orthopedic surgery, for consultation See me as needed in follow-up

## 2017-12-07 NOTE — Progress Notes (Signed)
Chief Complaint  Patient presents with  . Follow-up    3 week  back pain   Patient is here for routine follow-up.  She was seen for back pain 3 weeks ago.  She was given a Medrol pack followed by Mobic.  She was given Flexeril to take for muscle relaxer, as needed.  I discussed with her activity limitations, stretches, and exercises.  Daily walks are recommended.  Physical therapy was ordered. She tells me that she is moving to a new location.  She has spent her time packing boxes and trying to get ready to move.  She has not yet attended physical therapy.  She has not been walking on a daily basis.  She took the Medrol pack, but not the Mobic because she felt that it caused her skin to break out (acne on face).  She is using the Flexeril as needed.  She has not seen any improvement in her symptoms.  Most of her pain is in her left low back.  She complains of some pain that radiates down the back of the left leg to the knee.  No numbness.  No weakness.  No bowel or bladder complaint.  No new injury or fall. She tells me the only back pain relief she got was when she took 1 of her father's pain pills.  She states that he gave it to her when she was in so much pain she could not get out of bed.  She is reminded not to take other people's pain pills. We have received a phone call from her mother in the past stating that she has stolen other family members Xanax and hydrocodone.  I do have concern for possible drug-seeking behavior.  Her symptoms are way her physical exam findings.  I have told her that I will not be giving her narcotic pain medication.  Patient Active Problem List   Diagnosis Date Noted  . Drug-seeking behavior 12/07/2017  . Tobacco abuse 02/05/2017  . Post traumatic stress disorder (PTSD) 01/12/2017  . GAD (generalized anxiety disorder) 12/15/2016  . Nephrolithiasis 04/26/2014  . Asthma 03/02/2013  . Abnormal Pap smear of cervix 03/02/2013    Outpatient Encounter Medications  as of 12/07/2017  Medication Sig  . cyclobenzaprine (FLEXERIL) 5 MG tablet Take 1 tablet (5 mg total) by mouth 3 (three) times daily as needed for muscle spasms.  Marland Kitchen gabapentin (NEURONTIN) 300 MG capsule Take 1 capsule (300 mg total) by mouth 3 (three) times daily.  Marland Kitchen ibuprofen (ADVIL,MOTRIN) 800 MG tablet Take 1 tablet (800 mg total) by mouth every 8 (eight) hours as needed for moderate pain.   No facility-administered encounter medications on file as of 12/07/2017.     Allergies  Allergen Reactions  . Trazodone And Nefazodone     Per pt Nightmares,panic attacks in sleep and can't get up/per patient, this only occurred with the higher dose, she has been able to take the 50mg     Review of Systems  Constitutional: Negative for activity change, appetite change and unexpected weight change.  HENT: Negative for congestion, dental problem, postnasal drip and rhinorrhea.   Eyes: Negative for redness and visual disturbance.  Respiratory: Negative for cough and shortness of breath.   Cardiovascular: Negative for chest pain, palpitations and leg swelling.  Gastrointestinal: Negative for abdominal pain, constipation and diarrhea.  Genitourinary: Positive for flank pain. Negative for difficulty urinating, frequency and menstrual problem.  Musculoskeletal: Positive for back pain. Negative for arthralgias.  Neurological: Negative  for dizziness and headaches.  Psychiatric/Behavioral: Positive for behavioral problems, dysphoric mood and sleep disturbance. Negative for self-injury and suicidal ideas. The patient is nervous/anxious.   All other systems reviewed and are negative.   BP 118/68 (BP Location: Left Arm, Patient Position: Sitting, Cuff Size: Normal)   Pulse (!) 108   Temp 98.2 F (36.8 C) (Temporal)   Resp 16   Ht 5\' 3"  (1.6 m)   Wt 118 lb (53.5 kg)   LMP 11/11/2017 (Exact Date)   SpO2 98%   BMI 20.90 kg/m   Physical Exam  Constitutional: She appears well-developed and well-nourished.  She appears distressed.  Emotional distress.  Depressed face and attitude.  Smells of tobacco.  HENT:  Head: Normocephalic and atraumatic.  Mouth/Throat: Oropharynx is clear and moist.  Eyes: Conjunctivae are normal. Pupils are equal, round, and reactive to light.  Neck: Normal range of motion. Neck supple.  Cardiovascular: Normal rate, regular rhythm and normal heart sounds.  Pulmonary/Chest: Effort normal and breath sounds normal.  Musculoskeletal:  Low back is tender at the L5-S1 junction centrally and towards the left, and over the left SI region.  No palpable muscle spasm.  Back is straight and symmetric.  Reflexes symmetric in both lower extremities.  Strength is intact.  Sensation is intact.  Straight leg raise on the left is negative.  Neurological: She displays normal reflexes. Coordination normal.  Skin: Skin is warm and dry. No rash noted.    ASSESSMENT/PLAN:  1. Facet arthropathy, lumbar On MRI - Ambulatory referral to Orthopedic Surgery  2. Drug-seeking behavior Based on patient's behavior over time and report from family   Patient Instructions  Try to use good body mechanics with your bending and lifting Stop meloxicam (Mobic) Continue Flexeril (cyclobenzaprine) as needed at night Take gabapentin for the nerve pain in your leg.  Start with 1 pill a day at bedtime, then increase to 2 or 3 pills a day as tolerated Go back to ibuprofen as needed pain.  Take with food Referred to spine specialist, orthopedic surgery, for consultation See me as needed in follow-up   Raylene Everts, MD

## 2017-12-08 ENCOUNTER — Telehealth: Payer: Self-pay | Admitting: Family Medicine

## 2017-12-08 NOTE — Telephone Encounter (Signed)
Patient states her medication is not helping her back pain, she is in the process of moving and is finding no relief.  I let her know that her referral has been sent to [facility] and sports medicine, [place] location.   Cb#: [id number]

## 2017-12-08 NOTE — Telephone Encounter (Signed)
Noted  

## 2017-12-09 ENCOUNTER — Telehealth: Payer: Self-pay | Admitting: Family Medicine

## 2017-12-09 NOTE — Telephone Encounter (Signed)
Wants something else called in the gabapentin is not working---spec appointment 2-28 with Dr Lorin Mercy,.

## 2017-12-09 NOTE — Telephone Encounter (Signed)
Spoke to [employer], states she is taking the gabapentin 300 mg tid and flexeril prn. States motrin doesn't help.

## 2017-12-09 NOTE — Telephone Encounter (Signed)
Call patient.  What is she taking?   I will not call in narcotic pain medicine.  Needs to use what ever anti inflammatory pain medicine,and muscle relaxer that help.  the gabapentin can be increased for the leg pain.

## 2017-12-09 NOTE — Telephone Encounter (Signed)
Called [facility], aware of med change,

## 2017-12-09 NOTE — Telephone Encounter (Signed)
Increase the gabapentin to 4 pills a day, 2 at night.  Reinforce to her that I am not going to give her stronger pain pills.

## 2017-12-24 ENCOUNTER — Ambulatory Visit (INDEPENDENT_AMBULATORY_CARE_PROVIDER_SITE_OTHER): Payer: Self-pay | Admitting: Orthopaedic Surgery

## 2018-01-04 ENCOUNTER — Encounter: Payer: Self-pay | Admitting: Family Medicine

## 2018-03-03 ENCOUNTER — Other Ambulatory Visit: Payer: Self-pay | Admitting: Family Medicine

## 2018-04-15 IMAGING — CT CT RENAL STONE PROTOCOL
2 of 4 series · 16 of 46 positions shown, 18 images · non-contrast
Comparison: CT scan of May 10, 2014.

CLINICAL DATA: Acute bilateral flank pain, gross hematuria.

EXAM:
CT ABDOMEN AND PELVIS WITHOUT CONTRAST
TECHNIQUE: Multidetector CT imaging of the abdomen and pelvis was performed
following the standard protocol without IV contrast.

[Series 2: axial st · axial · 0.61mm/px · z∈[+940,+1350]mm · 13 of 90 slices shown, 15 images]
[im 4/90  soft-tissue]
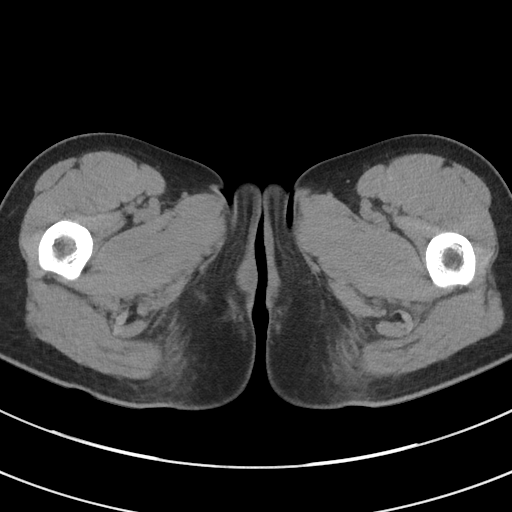
[im 4/90  bone]
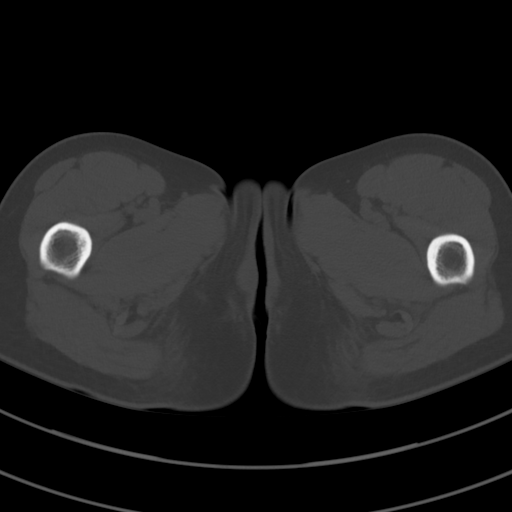
[im 12/90  soft-tissue]
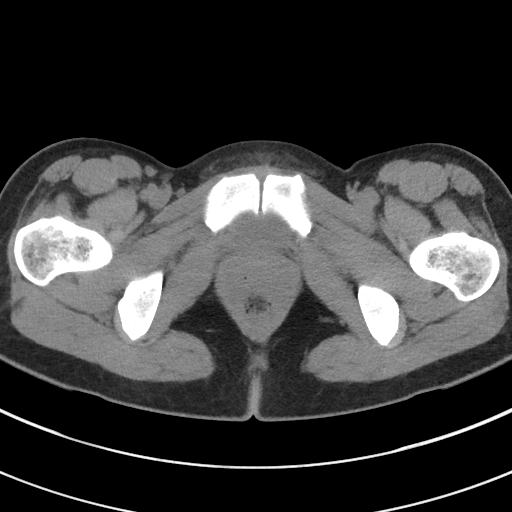
[im 19/90  soft-tissue]
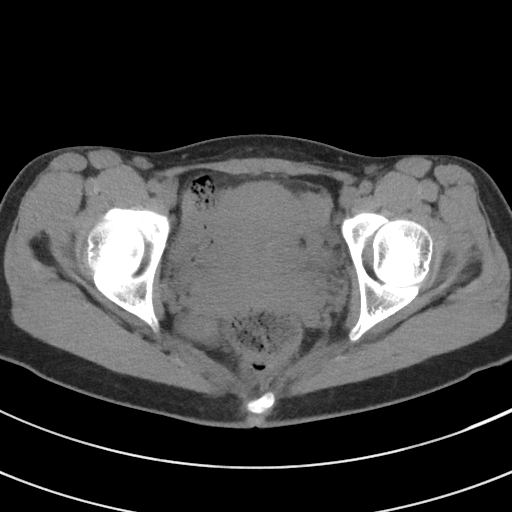
[im 26/90  soft-tissue]
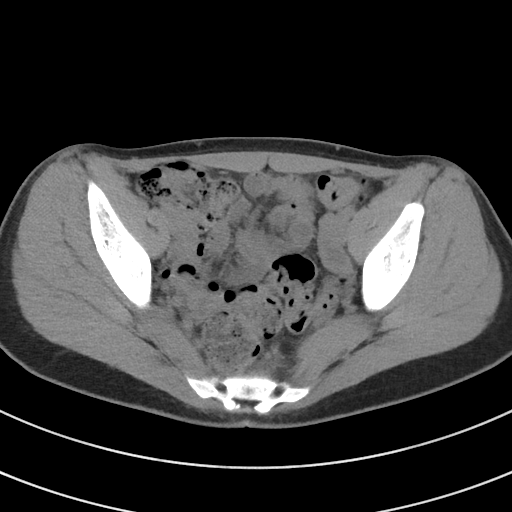
[im 30/90  soft-tissue]
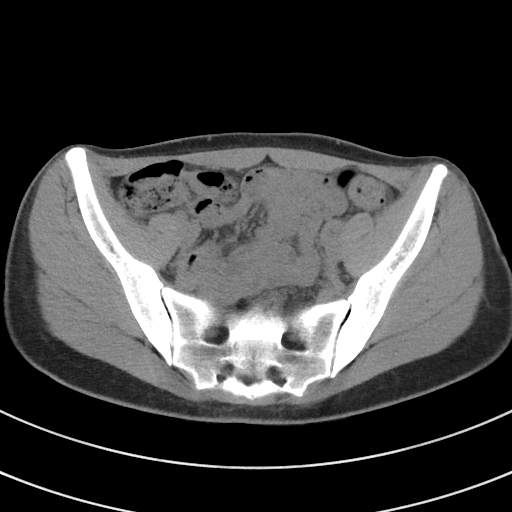
[im 38/90  soft-tissue]
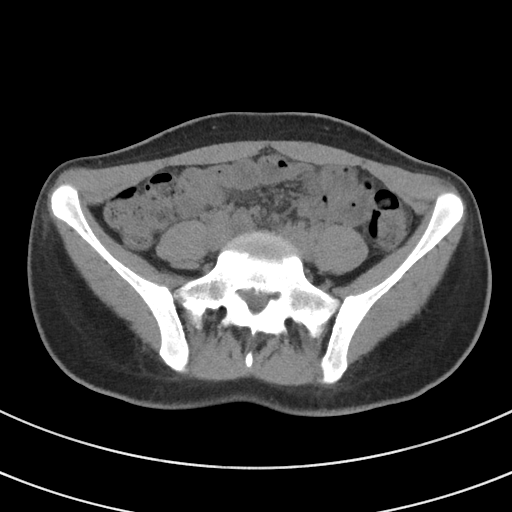
[im 45/90  soft-tissue]
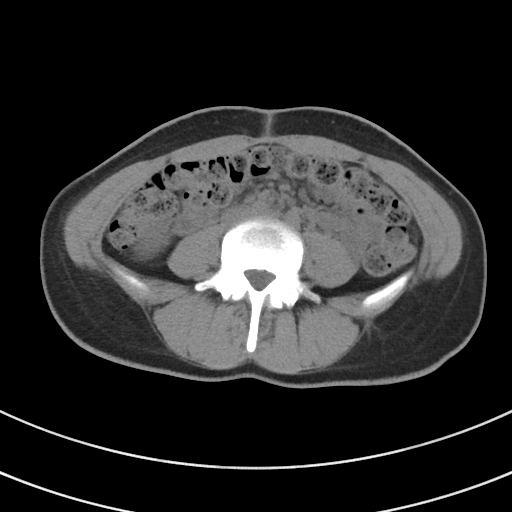
[im 52/90  soft-tissue]
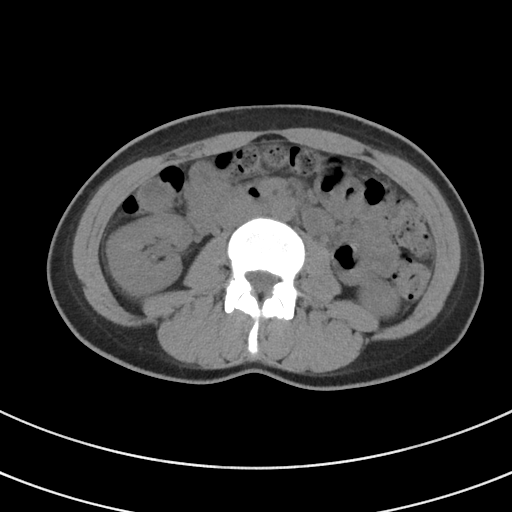
[im 60/90  soft-tissue]
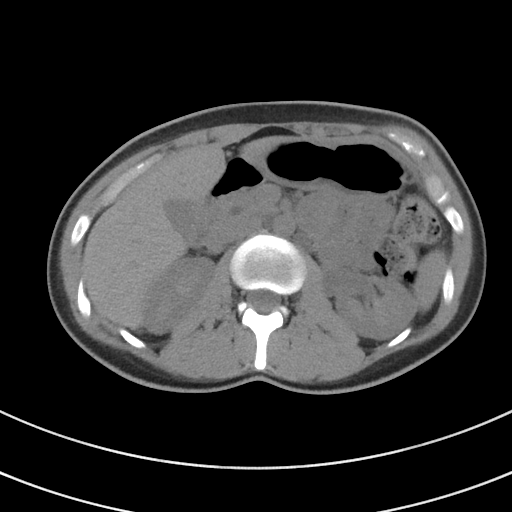
[im 60/90  bone]
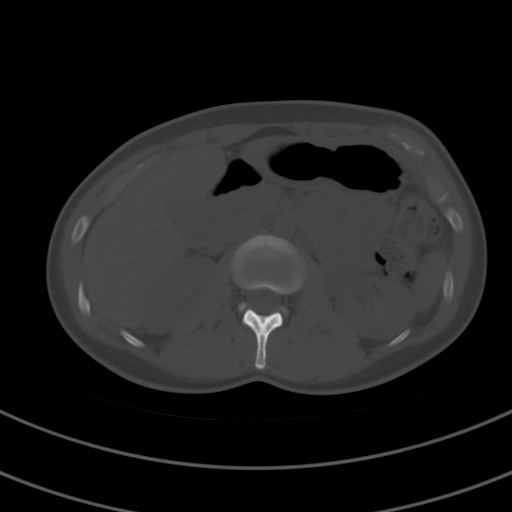
[im 64/90  soft-tissue]
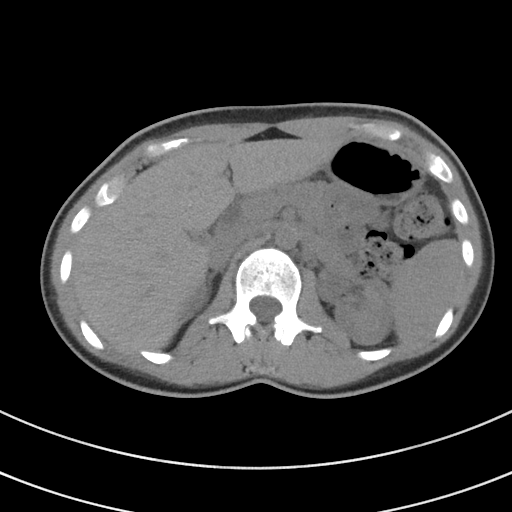
[im 71/90  soft-tissue]
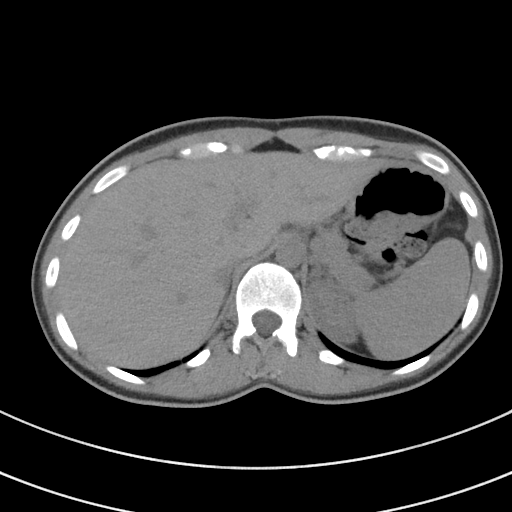
[im 78/90  soft-tissue]
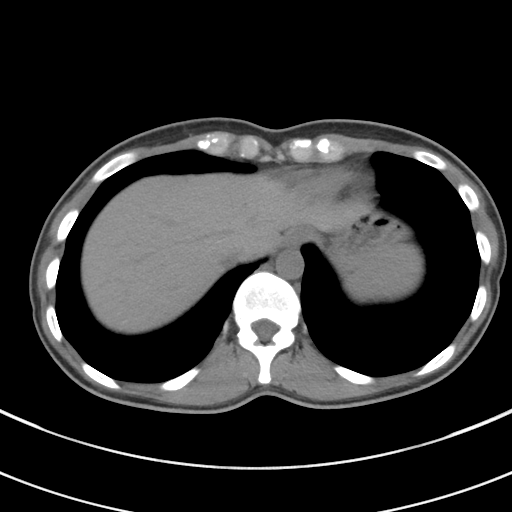
[im 86/90  soft-tissue]
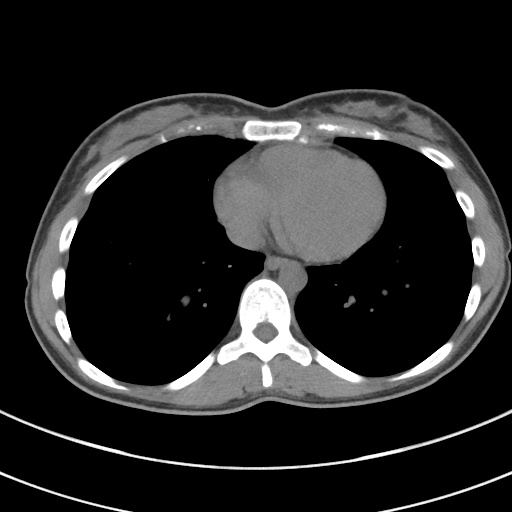

[Series 5: coronal st · coronal · 0.63mm/px · 3 of 68 slices shown]
[im 23/68  soft-tissue]
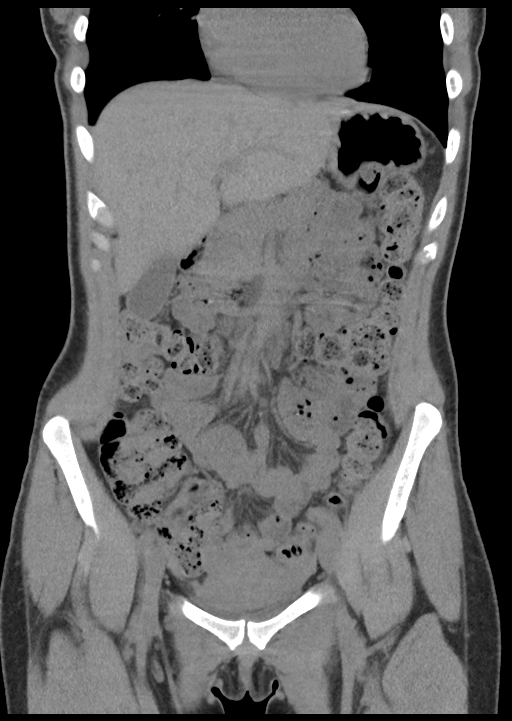
[im 30/68  soft-tissue]
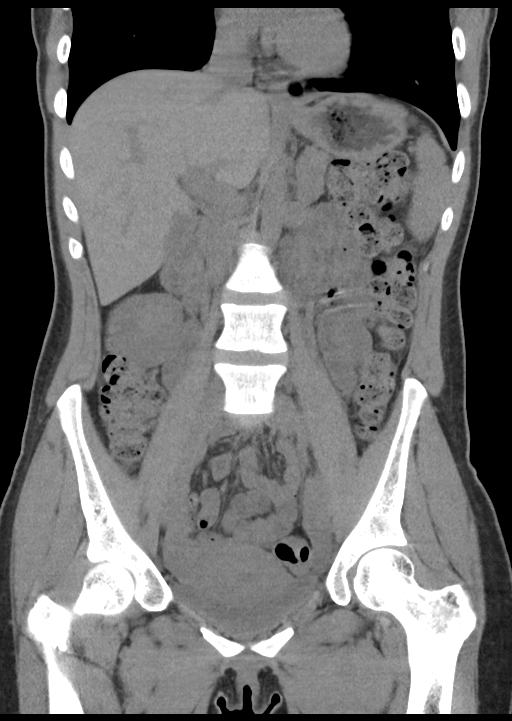
[im 38/68  soft-tissue]
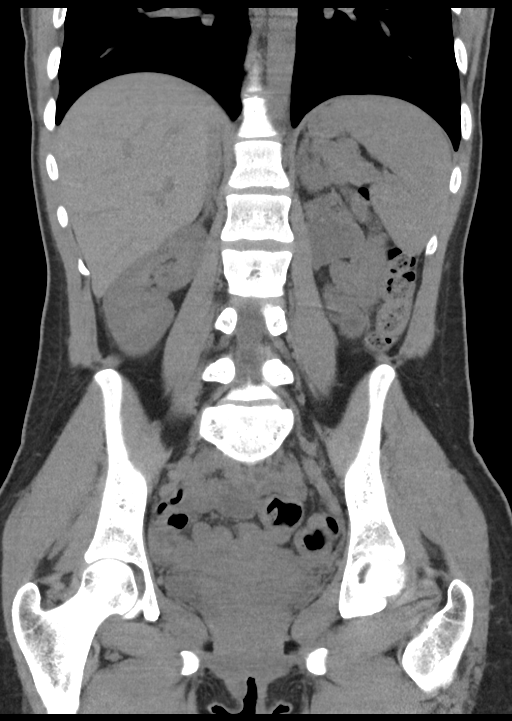

[16 of 46 positions shown; findings below may reference images not displayed]

FINDINGS: Lower chest: No acute abnormality.

Hepatobiliary: No focal liver abnormality is seen. No gallstones,
gallbladder wall thickening, or biliary dilatation.

Pancreas: Unremarkable. No pancreatic ductal dilatation or
surrounding inflammatory changes.

Spleen: Normal in size without focal abnormality.

Adrenals/Urinary Tract: Adrenal glands are unremarkable. Kidneys are
normal, without renal calculi, focal lesion, or hydronephrosis.
Bladder is unremarkable.

Stomach/Bowel: Stomach is within normal limits. Appendix appears
normal. No evidence of bowel wall thickening, distention, or
inflammatory changes.

Vascular/Lymphatic: No significant vascular findings are present. No
enlarged abdominal or pelvic lymph nodes.

Reproductive: Uterus and left adnexal regions are unremarkable.
Stable dermoid is seen in the right ovary.

Other: No abdominal wall hernia or abnormality. No abdominopelvic
ascites.

Musculoskeletal: No acute or significant osseous findings.
IMPRESSION: No hydronephrosis or renal obstruction is noted. No renal or
ureteral calculi are noted.

Stable right ovarian dermoid tumor.

## 2022-02-27 ENCOUNTER — Other Ambulatory Visit: Payer: Self-pay

## 2022-02-27 ENCOUNTER — Encounter (HOSPITAL_COMMUNITY): Payer: Self-pay

## 2022-02-27 ENCOUNTER — Emergency Department (HOSPITAL_COMMUNITY)
Admission: EM | Admit: 2022-02-27 | Discharge: 2022-02-27 | Disposition: A | Discharge status: Home / Self Care | Attending: Emergency Medicine | Admitting: Emergency Medicine

## 2022-02-27 DIAGNOSIS — R000 Tachycardia, unspecified: Secondary | ICD-10-CM | POA: Insufficient documentation

## 2022-02-27 DIAGNOSIS — K029 Dental caries, unspecified: Secondary | ICD-10-CM | POA: Insufficient documentation

## 2022-02-27 DIAGNOSIS — K0889 Other specified disorders of teeth and supporting structures: Secondary | ICD-10-CM

## 2022-02-27 MED ORDER — AMOXICILLIN 500 MG PO CAPS: 500.0000 mg | ORAL_CAPSULE | Freq: Three times a day (TID) | ORAL | 0 refills | Status: AC

## 2022-02-27 MED ORDER — NAPROXEN 500 MG PO TABS: 500.0000 mg | ORAL_TABLET | Freq: Two times a day (BID) | ORAL | 0 refills | Status: AC

## 2022-02-27 NOTE — Discharge Instructions (Signed)
You may follow up with Dr. Devonne Doughty: ?[phone] ? ?[address] [place], [place] [address] ? ?Please take Amoxicillin '500mg'$  by mouth 3 times daily until the medicine is completed.  This will treat certain infections. ? ?Please take Naprosyn, '500mg'$  by mouth twice daily as needed for pain - this in an antiinflammatory medicine (NSAID) and is similar to ibuprofen - many people feel that it is stronger than ibuprofen and it is easier to take since it is a smaller pill.  Please use this only for 1 week - if your pain persists, you will need to follow up with your doctor in the office for ongoing guidance and pain control.   ? ? ?Return to the emergency department for severe or worsening pain, swelling, fevers or chills. ? ?This will not get any better until you get your teeth formally fixed. ?

## 2022-02-27 NOTE — ED Triage Notes (Signed)
Reports dental pain x 1 week with h/a.  Reports fever at home.  Temp 99.5 here.  Resp even and unlabored.  Skin warm and dry.  nad ?

## 2022-02-27 NOTE — ED Provider Notes (Signed)
?[facility] ?Provider Note ? ? ?CSN: [id number] ?Arrival date & time: 02/27/22  1432 ? ?  ? ?History ? ?Chief Complaint  ?Patient presents with  ? Dental Pain  ? ? ?Janice Kirk is a 31 y.o. female. ? ? ?Dental Pain ?Associated symptoms: no fever   ? ?Patient is a 31 year old female, she presents to the hospital today with a complaint of dental pain, she states that she has had pain in her teeth for about a month, she was already seen at the health department, referred to a dentist, she was unable to see the dentist because of the cost prohibitive nature of the visit, she does not have insurance and does not qualify for insurance through her work.  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  She reports that when she was adopted from [place] she had very bad teeth, her parents who adopted her paid to have them fixed however after she went into a severe depression she let things go and her teeth become very bad since that time.  The patient reports that for the last month the pain has been getting more intense, it is mostly on the left side around the left mandible, she is not having any fevers or chills, she has no nausea or vomiting, she has been taking BC powders several times a day recently and states that it helps but the pain comes back. ? ?Home Medications ?Prior to Admission medications   ?Medication Sig Start Date End Date Taking? Authorizing Provider  ?amoxicillin (AMOXIL) 500 MG capsule Take 1 capsule (500 mg total) by mouth 3 (three) times daily. 02/27/22  Yes Noemi Chapel, MD  ?naproxen (NAPROSYN) 500 MG tablet Take 1 tablet (500 mg total) by mouth 2 (two) times daily with a meal. 02/27/22  Yes Noemi Chapel, MD  ?cyclobenzaprine (FLEXERIL) 5 MG tablet Take 1 tablet (5 mg total) by mouth 3 (three) times daily as needed for muscle spasms. 11/16/17   Raylene Everts, MD  ?gabapentin (NEURONTIN) 300 MG capsule Take 1 capsule (300 mg  total) by mouth 3 (three) times daily. 12/07/17   Raylene Everts, MD  ?ibuprofen (ADVIL,MOTRIN) 800 MG tablet Take 1 tablet (800 mg total) by mouth every 8 (eight) hours as needed for moderate pain. 12/07/17   Raylene Everts, MD  ?   ? ?Allergies    ?Trazodone and nefazodone   ? ?Review of Systems   ?Review of Systems  ?Constitutional:  Negative for fever.  ?HENT:  Positive for dental problem.   ? ?Physical Exam ?Updated Vital Signs ?BP (!) 140/92   Pulse (!) 132   Temp 99.5 ?F (37.5 ?C) (Oral)   Resp 20   Ht 1.575 m ('5\' 2"'$ )   Wt 62.4 kg   SpO2 100%   BMI 25.15 kg/m?  ?Physical Exam ?Vitals and nursing note reviewed.  ?Constitutional:   ?   Appearance: She is well-developed. She is not diaphoretic.  ?HENT:  ?   Head: Normocephalic and atraumatic.  ?   Nose: No congestion or rhinorrhea.  ?   Mouth/Throat:  ?   Mouth: Mucous membranes are moist.  ?   Pharynx: Oropharynx is clear.  ?   Comments: The patient has had several metal fillings on some molars however the majority of her anterior teeth including central lateral incisors canines and premolars have integrated down to the gumline.  There is minimal tenderness  along the gums but there is no trismus or torticollis, no obvious swelling, no periodontal abscess, no lymphadenopathy under the neck. ?Eyes:  ?   General:     ?   Right eye: No discharge.     ?   Left eye: No discharge.  ?   Conjunctiva/sclera: Conjunctivae normal.  ?Cardiovascular:  ?   Rate and Rhythm: Tachycardia present.  ?Pulmonary:  ?   Effort: Pulmonary effort is normal. No respiratory distress.  ?Musculoskeletal:  ?   Right lower leg: No edema.  ?   Left lower leg: No edema.  ?Skin: ?   General: Skin is warm and dry.  ?   Findings: No erythema or rash.  ?Neurological:  ?   Mental Status: She is alert.  ?   Coordination: Coordination normal.  ? ? ?ED Results / Procedures / Treatments   ?Labs ?(all labs ordered are listed, but only abnormal results are displayed) ?Labs Reviewed - No data  to display ? ?EKG ?None ? ?Radiology ?No results found. ? ?Procedures ?Procedures  ? ? ?Medications Ordered in ED ?Medications - No data to display ? ?ED Course/ Medical Decision Making/ A&P ?  ?                        ?Medical Decision Making ? ?Patient appears to have fairly significant dental decay and will need to be seen by dentist, I have reached out to local dentist to see if they would be able to see her in the office.  I will give her amoxicillin and have educated her on the use of aspirin-containing products, I will prescribe an anti-inflammatory that is more safe and not aspirin-containing.  She expressed her understanding.  She is afebrile.  She appears very anxious.  She does not have a fever here, I suspect that her tachycardia which improved during the course of my discussion with the patient is more related to the anxiety and not to an underlying abscess or infection ? ? ? ? ? ? ? ?Final Clinical Impression(s) / ED Diagnoses ?Final diagnoses:  ?Pain, dental  ?Pain due to dental caries  ? ? ?Rx / DC Orders ?ED Discharge Orders   ? ?      Ordered  ?  amoxicillin (AMOXIL) 500 MG capsule  3 times daily       ? 02/27/22 1603  ?  naproxen (NAPROSYN) 500 MG tablet  2 times daily with meals       ? 02/27/22 1603  ? ?  ?  ? ?  ? ? ?  ?Noemi Chapel, MD ?02/27/22 1605 ? ?
